# Patient Record
Sex: Female | Born: 1995 | Race: Black or African American | Hispanic: No | Marital: Single | Smoking: Never smoker
Health system: Southern US, Community
[De-identification: ages and names within clinical notes are randomized; demographics above are authoritative.]

## PROBLEM LIST (undated history)

## (undated) ENCOUNTER — Inpatient Hospital Stay (HOSPITAL_COMMUNITY): Payer: Self-pay

## (undated) DIAGNOSIS — B999 Unspecified infectious disease: Secondary | ICD-10-CM

## (undated) HISTORY — PX: NO PAST SURGERIES: SHX2092

---
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
## 2015-06-13 IMAGING — US US OB COMP LESS 14 WK
1 series · 14 of 28 positions shown · non-contrast
Comparison: None.

CLINICAL DATA: Vaginal bleeding.

EXAM:
OBSTETRIC <14 WK US AND TRANSVAGINAL OB US
TECHNIQUE: Both transabdominal and transvaginal ultrasound examinations were
performed for complete evaluation of the gestation as well as the
maternal uterus, adnexal regions, and pelvic cul-de-sac.
Transvaginal technique was performed to assess early pregnancy.

[Series 1: us ob comp less 14 wk · 0.17mm/px · 14 of 68 slices shown]
[im 3/68]
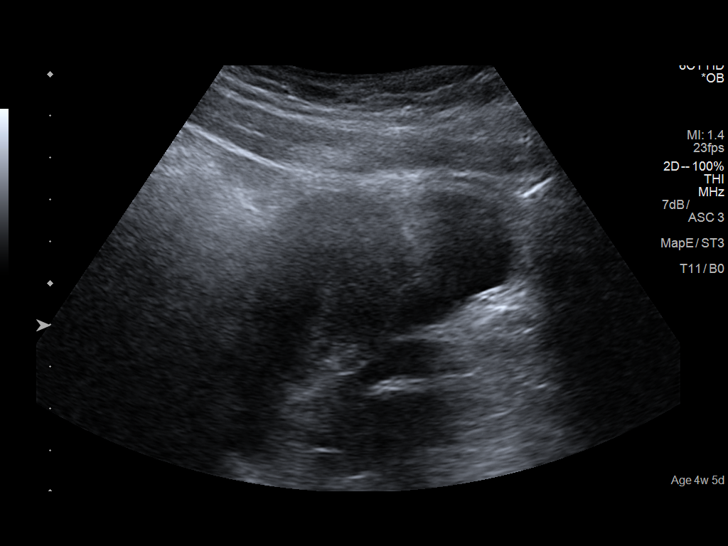
[im 8/68]
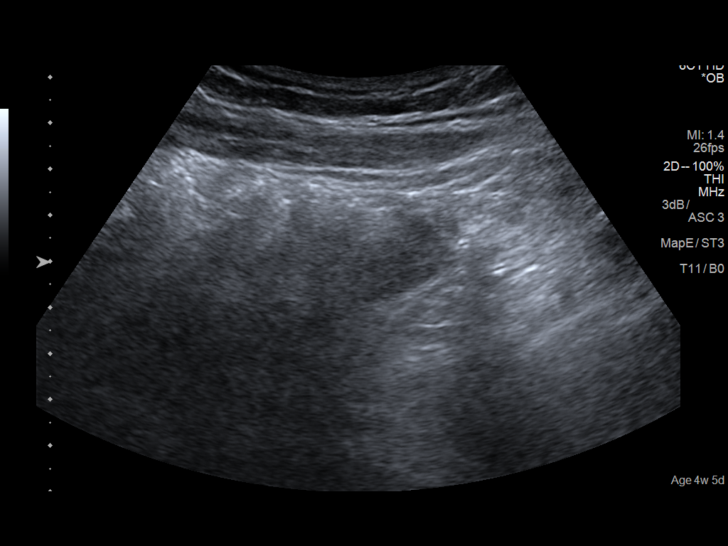
[im 13/68]
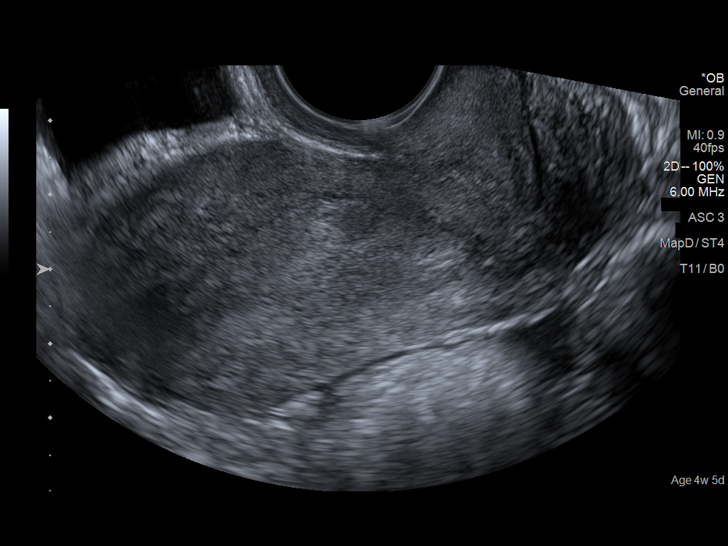
[im 18/68]
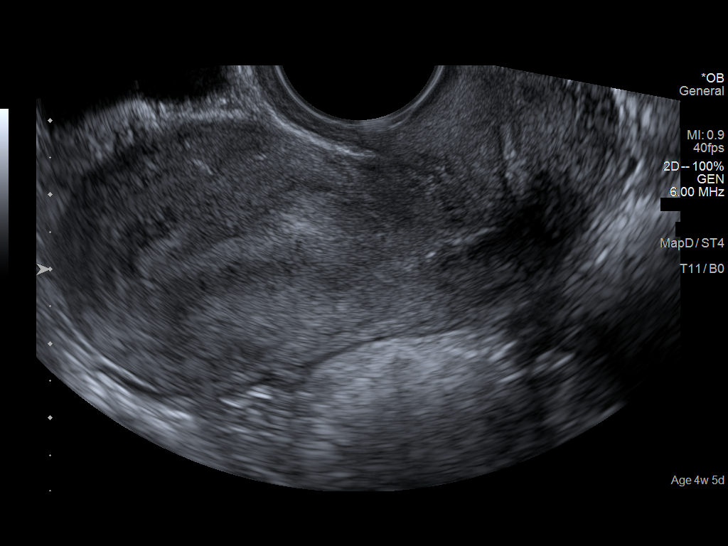
[im 23/68]
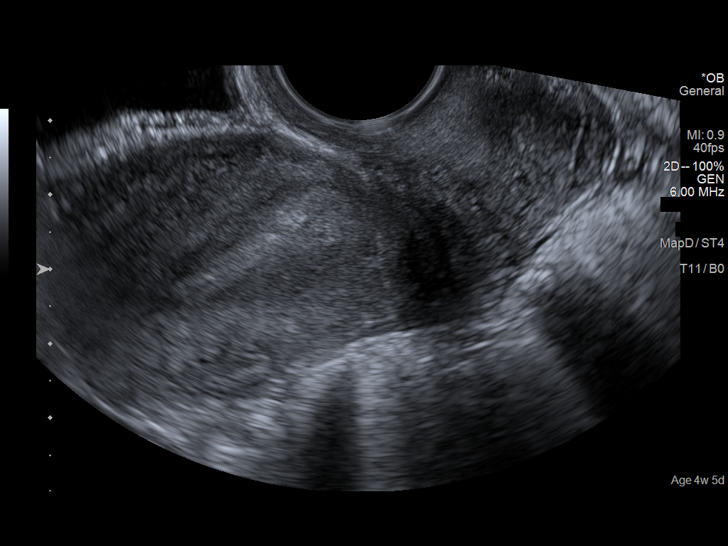
[im 28/68]
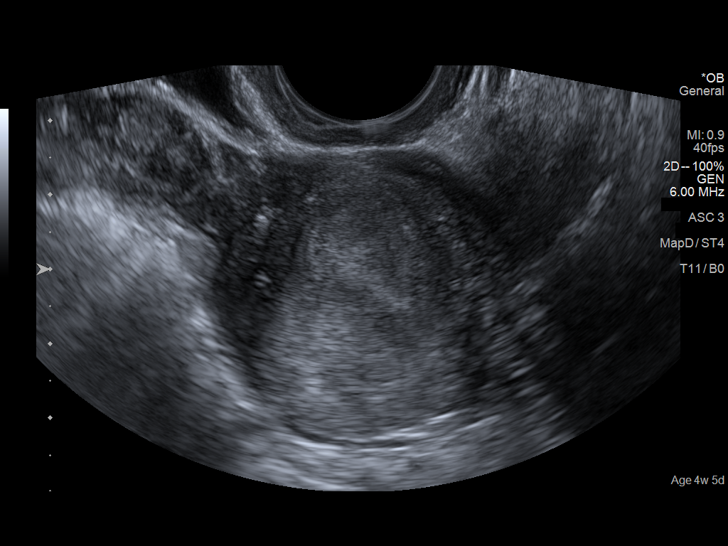
[im 33/68]
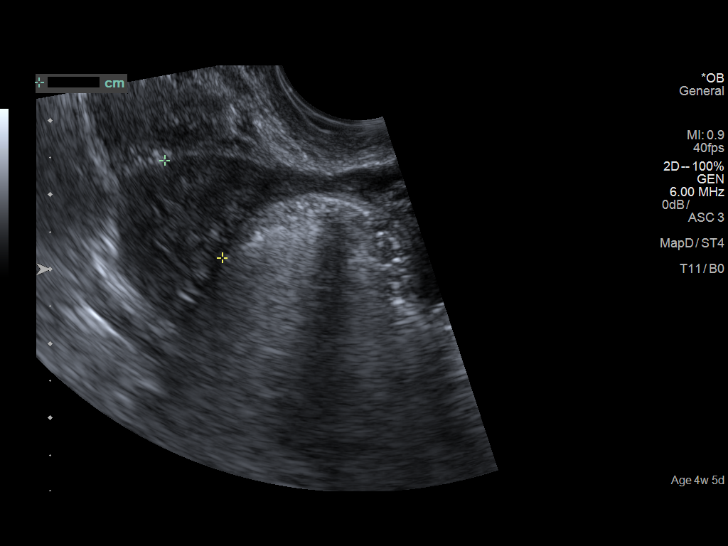
[im 38/68]
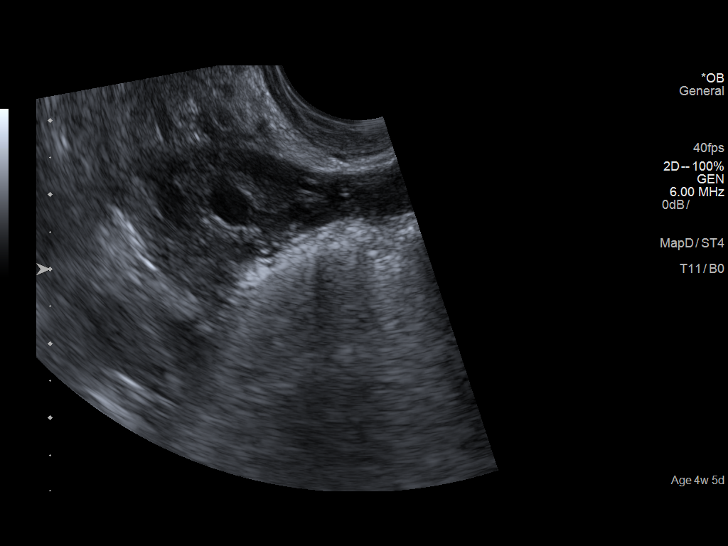
[im 43/68]
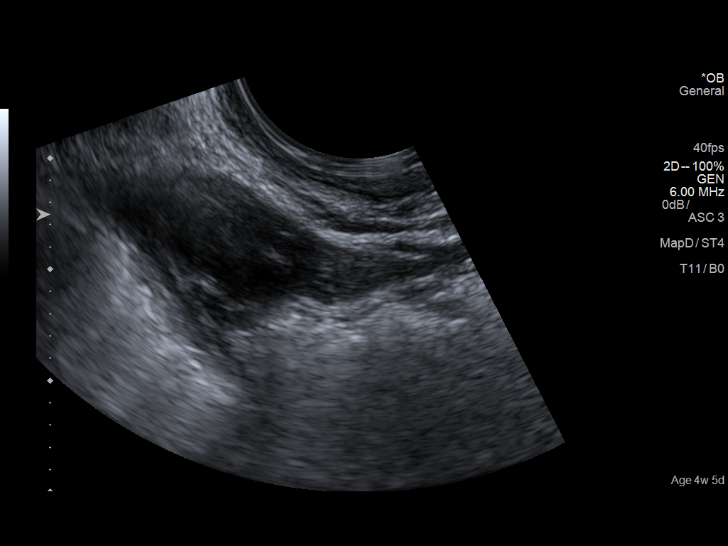
[im 48/68]
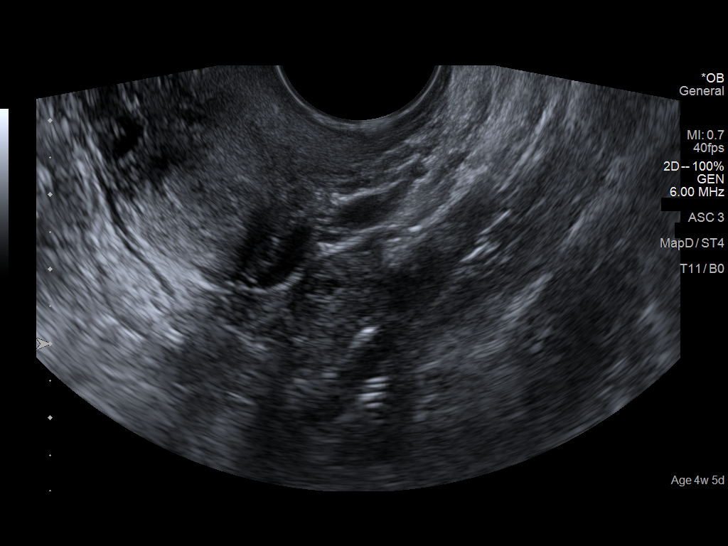
[im 53/68]
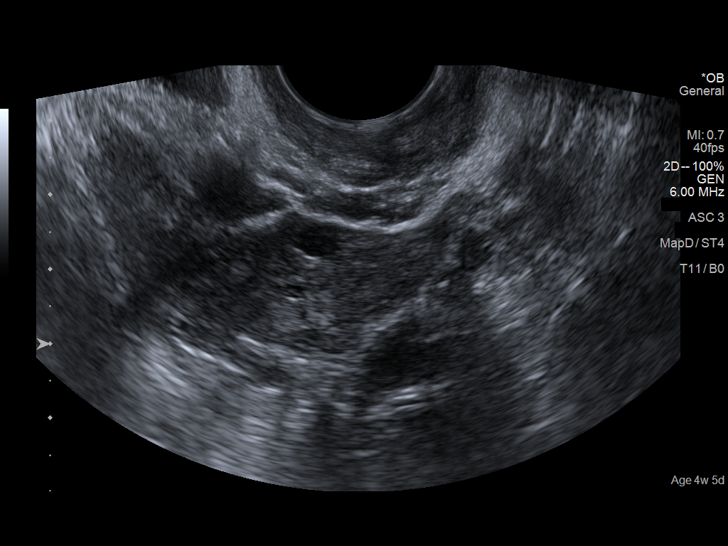
[im 58/68]
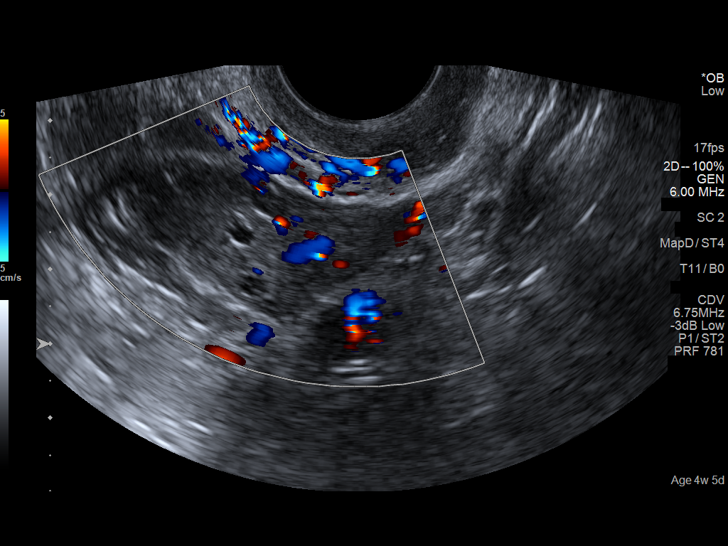
[im 63/68]
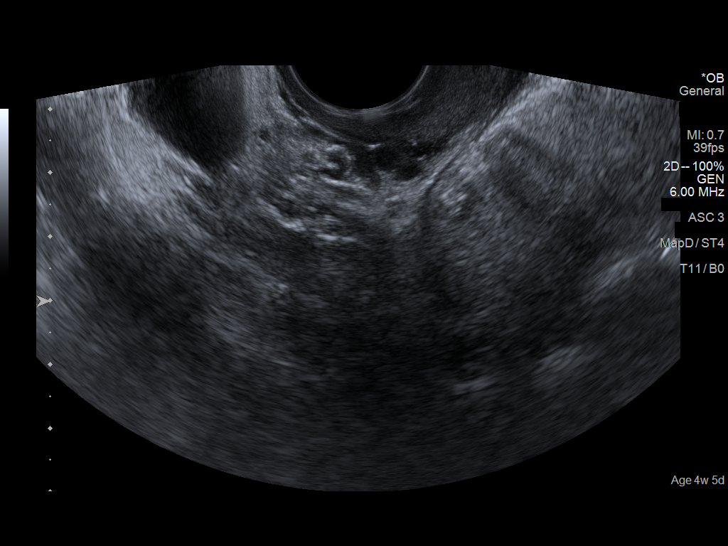
[im 68/68]
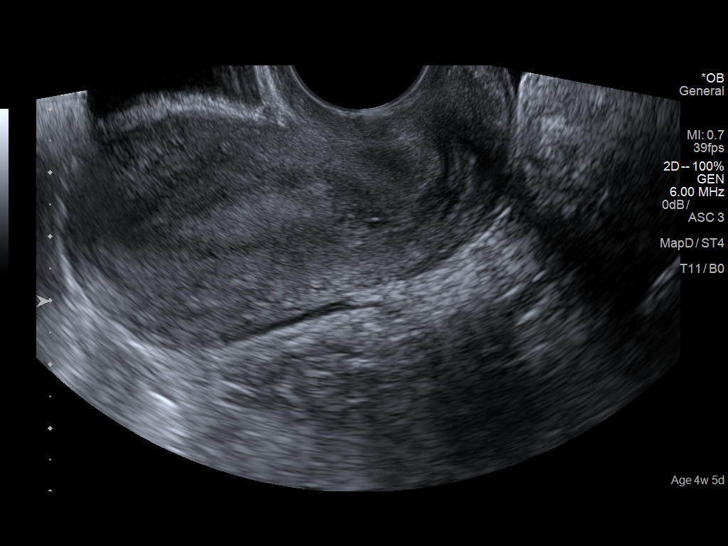

[14 of 28 positions shown; findings below may reference images not displayed]

FINDINGS: Intrauterine gestational sac: None

Yolk sac:  No

Embryo:  No

Cardiac Activity: No

Maternal uterus/adnexae: Ovaries are normal. Trace free fluid in the
pelvis.
IMPRESSION: Negative exam.  No evidence of intrauterine or ectopic pregnancy.

## 2015-12-09 ENCOUNTER — Emergency Department (HOSPITAL_COMMUNITY)
Admission: EM | Admit: 2015-12-09 | Discharge: 2015-12-09 | Disposition: A | Discharge status: Home / Self Care | Payer: Medicaid Other | Attending: Emergency Medicine | Admitting: Emergency Medicine

## 2015-12-09 ENCOUNTER — Emergency Department (HOSPITAL_COMMUNITY): Payer: Medicaid Other

## 2015-12-09 ENCOUNTER — Encounter (HOSPITAL_COMMUNITY): Payer: Self-pay

## 2015-12-09 DIAGNOSIS — R102 Pelvic and perineal pain: Secondary | ICD-10-CM | POA: Diagnosis not present

## 2015-12-09 DIAGNOSIS — Z3A01 Less than 8 weeks gestation of pregnancy: Secondary | ICD-10-CM | POA: Insufficient documentation

## 2015-12-09 DIAGNOSIS — N939 Abnormal uterine and vaginal bleeding, unspecified: Secondary | ICD-10-CM

## 2015-12-09 DIAGNOSIS — O208 Other hemorrhage in early pregnancy: Secondary | ICD-10-CM | POA: Insufficient documentation

## 2015-12-09 DIAGNOSIS — O3680X0 Pregnancy with inconclusive fetal viability, not applicable or unspecified: Secondary | ICD-10-CM

## 2015-12-09 DIAGNOSIS — R8271 Bacteriuria: Secondary | ICD-10-CM | POA: Diagnosis not present

## 2015-12-09 LAB — CBC WITH DIFFERENTIAL/PLATELET
BASOS ABS: 0 10*3/uL (ref 0.0–0.1)
BASOS PCT: 0 %
Eosinophils Absolute: 0.4 10*3/uL (ref 0.0–0.7)
Eosinophils Relative: 6 %
HEMATOCRIT: 38.3 % (ref 36.0–46.0)
HEMOGLOBIN: 12.5 g/dL (ref 12.0–15.0)
Lymphocytes Relative: 24 %
Lymphs Abs: 1.8 10*3/uL (ref 0.7–4.0)
MCH: 26.3 pg (ref 26.0–34.0)
MCHC: 32.6 g/dL (ref 30.0–36.0)
MCV: 80.6 fL (ref 78.0–100.0)
Monocytes Absolute: 0.5 10*3/uL (ref 0.1–1.0)
Monocytes Relative: 7 %
NEUTROS ABS: 4.6 10*3/uL (ref 1.7–7.7)
NEUTROS PCT: 63 %
Platelets: 267 10*3/uL (ref 150–400)
RBC: 4.75 MIL/uL (ref 3.87–5.11)
RDW: 13.2 % (ref 11.5–15.5)
WBC: 7.4 10*3/uL (ref 4.0–10.5)

## 2015-12-09 LAB — ABO/RH: ABO/RH(D): A POS

## 2015-12-09 LAB — URINE MICROSCOPIC-ADD ON

## 2015-12-09 LAB — WET PREP, GENITAL
CLUE CELLS WET PREP: NONE SEEN
Sperm: NONE SEEN
TRICH WET PREP: NONE SEEN
YEAST WET PREP: NONE SEEN

## 2015-12-09 LAB — URINALYSIS, ROUTINE W REFLEX MICROSCOPIC
Glucose, UA: NEGATIVE mg/dL
Ketones, ur: 15 mg/dL — AB
Nitrite: NEGATIVE
SPECIFIC GRAVITY, URINE: 1.024 (ref 1.005–1.030)
pH: 5.5 (ref 5.0–8.0)

## 2015-12-09 LAB — HCG, QUANTITATIVE, PREGNANCY: HCG, BETA CHAIN, QUANT, S: 12 m[IU]/mL — AB (ref <5)

## 2015-12-09 LAB — POC URINE PREG, ED: PREG TEST UR: NEGATIVE

## 2015-12-09 MED ORDER — CEPHALEXIN 500 MG PO CAPS: 500.0000 mg | ORAL_CAPSULE | Freq: Three times a day (TID) | ORAL | 0 refills | Status: AC

## 2015-12-09 NOTE — ED Notes (Signed)
Pt returned from US

## 2015-12-09 NOTE — ED Provider Notes (Signed)
[facility] DEPT Provider Note   CSN: [id number] Arrival date & time: 12/09/15  1102  First Provider Contact:  First MD Initiated Contact with Patient 12/09/15 1140        History   Chief Complaint Chief Complaint  Patient presents with  . Vaginal Bleeding    HPI Linda Curry is a 20 y.o. female.  HPI 20 year old G1P0 at estimated 4-1/2 weeks by LMP who presents with lower abdominal cramping and vaginal bleeding. The patient states that over the weekend she took a pregnancy test and noted that it was positive. Starting over the weekend she had a small amount of vaginal spotting, which she thought was due to implantation bleeding. This was minimal and resolved with wiping with toilet paper. She denies any pain at that time. The last 24 hours however she's had progressively worsening lower abdominal cramping. She also began to pass clots and had increase in vaginal bleeding. She has soaked through approximately 1 pad today. She has no history of STDs. No previous history of pregnancies. No nausea or vomiting. No fevers or chills. No dyspareunia.  History reviewed. No pertinent past medical history.  There are no active problems to display for this patient.   History reviewed. No pertinent surgical history.  OB History    Gravida Para Term Preterm AB Living   1             SAB TAB Ectopic Multiple Live Births                   Home Medications    Prior to Admission medications   Medication Sig Start Date End Date Taking? Authorizing Provider  amoxicillin-clavulanate (AUGMENTIN) 875-125 MG per tablet Take 1 tablet by mouth every 12 (twelve) hours. 03/13/12   Viviano Simas, NP  cephALEXin (KEFLEX) 500 MG capsule Take 1 capsule (500 mg total) by mouth 3 (three) times daily. 12/09/15 12/16/15  Shaune Pollack, MD  DM-Doxylamine-Acetaminophen (NYQUIL COLD & FLU PO) Take 1 tablet by mouth daily as needed (cold symptoms).    Historical Provider, MD  lidocaine (XYLOCAINE) 2  % solution Use as directed 20 mLs in the mouth or throat as needed for mouth pain. 04/17/14   Heather Laisure, PA-C  penicillin v potassium (VEETID) 500 MG tablet Take 1 tablet (500 mg total) by mouth 2 (two) times daily. 04/17/14   Heather Laisure, PA-C  Pseudoephedrine-APAP-DM (DAYQUIL PO) Take 2 tablets by mouth daily as needed (for cold symptoms).    Historical Provider, MD    Family History No family history on file.  Social History Social History  Substance Use Topics  . Smoking status: Never Smoker  . Smokeless tobacco: Never Used  . Alcohol use No     Allergies   Review of patient's allergies indicates no known allergies.   Review of Systems Review of Systems  Constitutional: Negative for chills, fatigue and fever.  HENT: Negative for congestion and rhinorrhea.   Eyes: Negative for visual disturbance.  Respiratory: Negative for cough, shortness of breath and wheezing.   Cardiovascular: Negative for chest pain and leg swelling.  Gastrointestinal: Negative for abdominal pain, diarrhea, nausea and vomiting.  Genitourinary: Positive for pelvic pain, vaginal bleeding and vaginal pain. Negative for dysuria, flank pain and vaginal discharge.  Musculoskeletal: Negative for neck pain and neck stiffness.  Skin: Negative for rash and wound.  Allergic/Immunologic: Negative for immunocompromised state.  Neurological: Negative for syncope, weakness and headaches.  All other systems reviewed and are negative.  Physical Exam Updated Vital Signs BP 125/78   Pulse 100   Temp 99.4 F (37.4 C) (Oral)   Resp 16   Ht 5' (1.524 m)   Wt 115 lb (52.2 kg)   LMP 11/06/2015   SpO2 97%   BMI 22.46 kg/m   Physical Exam  Constitutional: She is oriented to person, place, and time. She appears well-developed and well-nourished. No distress.  HENT:  Head: Normocephalic and atraumatic.  Mouth/Throat: Oropharynx is clear and moist.  Eyes: Conjunctivae are normal. Pupils are equal,  round, and reactive to light.  Neck: Neck supple.  Cardiovascular: Normal rate, regular rhythm and normal heart sounds.  Exam reveals no friction rub.   No murmur heard. Pulmonary/Chest: Effort normal and breath sounds normal. No respiratory distress. She has no wheezes. She has no rales.  Abdominal: She exhibits no distension. There is tenderness (Mild suprapubic tenderness).  Genitourinary: Pelvic exam was performed with patient supine.  Genitourinary Comments: Moderate volume dark red blood in vaginal vault. Cervical os is closed, with no active bleeding. No CMT. Uterus is not tender.  Musculoskeletal: She exhibits no edema.  Neurological: She is alert and oriented to person, place, and time. She exhibits normal muscle tone.  Skin: Skin is warm. Capillary refill takes less than 2 seconds.  Nursing note and vitals reviewed.    ED Treatments / Results  Labs (all labs ordered are listed, but only abnormal results are displayed) Labs Reviewed  WET PREP, GENITAL - Abnormal; Notable for the following:       Result Value   WBC, Wet Prep HPF POC MANY (*)    All other components within normal limits  URINALYSIS, ROUTINE W REFLEX MICROSCOPIC (NOT AT [facility]) - Abnormal; Notable for the following:    Color, Urine RED (*)    APPearance TURBID (*)    Hgb urine dipstick LARGE (*)    Bilirubin Urine SMALL (*)    Ketones, ur 15 (*)    Protein, ur >300 (*)    Leukocytes, UA SMALL (*)    All other components within normal limits  HCG, QUANTITATIVE, PREGNANCY - Abnormal; Notable for the following:    hCG, Beta Chain, Quant, S 12 (*)    All other components within normal limits  URINE MICROSCOPIC-ADD ON - Abnormal; Notable for the following:    Squamous Epithelial / LPF 6-30 (*)    Bacteria, UA MANY (*)    All other components within normal limits  CBC WITH DIFFERENTIAL/PLATELET  POC URINE PREG, ED  ABO/RH  GC/CHLAMYDIA PROBE AMP ([place]) NOT AT [facility]    EKG  EKG Interpretation None         Radiology US Ob Comp Less 14 Wks  Result Date: 12/09/2015 CLINICAL DATA:  Vaginal bleeding. EXAM: OBSTETRIC <14 WK [place] AND TRANSVAGINAL OB US TECHNIQUE: Both transabdominal and transvaginal ultrasound examinations were performed for complete evaluation of the gestation as well as the maternal uterus, adnexal regions, and pelvic cul-de-sac. Transvaginal technique was performed to assess early pregnancy. COMPARISON:  None. FINDINGS: Intrauterine gestational sac: None Yolk sac:  No Embryo:  No Cardiac Activity: No Maternal uterus/adnexae: Ovaries are normal. Trace free fluid in the pelvis. IMPRESSION: Negative exam.  No evidence of intrauterine or ectopic pregnancy. Electronically Signed   By: Francene Boyers M.D.   On: 12/09/2015 14:04   US Ob Transvaginal  Result Date: 12/09/2015 CLINICAL DATA:  Vaginal bleeding. EXAM: OBSTETRIC <14 WK [place] AND TRANSVAGINAL OB US TECHNIQUE: Both transabdominal and transvaginal  ultrasound examinations were performed for complete evaluation of the gestation as well as the maternal uterus, adnexal regions, and pelvic cul-de-sac. Transvaginal technique was performed to assess early pregnancy. COMPARISON:  None. FINDINGS: Intrauterine gestational sac: None Yolk sac:  No Embryo:  No Cardiac Activity: No Maternal uterus/adnexae: Ovaries are normal. Trace free fluid in the pelvis. IMPRESSION: Negative exam.  No evidence of intrauterine or ectopic pregnancy. Electronically Signed   By: Francene BoyersJames  Maxwell M.D.   On: 12/09/2015 14:04    Procedures Procedures (including critical care time)  Medications Ordered in ED Medications - No data to display   Initial Impression / Assessment and Plan / ED Course  I have reviewed the triage vital signs and the nursing notes.  Pertinent labs & imaging results that were available during my care of the patient were reviewed by me and considered in my medical decision making (see chart for details).  Clinical Course  Value Comment By  Time  HCG, Beta Chain, Quant, S: (!) 12 (Reviewed) Shaune Pollackameron Delaine Canter, MD 08/10 1308  US OB Comp Less 14 Wks (Reviewed) Shaune Pollackameron Garrell Flagg, MD 08/10 1340  US OB Comp Less 14 Wks (Reviewed) Shaune Pollackameron Michaelangelo Mittelman, MD 08/10 1340  HCG, Beta Chain, Quant, S: (!) 12 (Reviewed) Shaune Pollackameron Cherrish Vitali, MD 08/10 1344  US OB Comp Less 14 Wks (Reviewed) Shaune Pollackameron Leviathan Macera, MD 08/10 1344  US OB Transvaginal (Reviewed) Shaune Pollackameron Elfrieda Espino, MD 08/10 1355    20 yo G1P0 at estimated 4.[redacted] wk GA by LMP who presents with lower abdominal cramping and vaginal bleeding. Unfortunately, primary suspicion is complete SAB. HCG is only 15 on quant and TVUS shows no intrauterine or other pregnancy. DDx includes threatened AB though low quant and degree of bleeding makes complete AB more likely. No signs of septic or incomplete AB. Pt HDS with stable Hgb. RH+ so Rhogam is not indicated. Discussed labs, imaging with pt in detail. I have also discussed with OB on call. Will send for 48 hr quant. Advised pt on DDx of complete AB, threatened AB, ectopic, versus less likely early pregnancy.  Final Clinical Impressions(s) / ED Diagnoses   Final diagnoses:  Vaginal bleeding  Abnormal uterine bleeding (AUB)  Pregnancy of unknown anatomic location  Bacteriuria    New Prescriptions Discharge Medication List as of 12/09/2015  2:25 PM    START taking these medications   Details  cephALEXin (KEFLEX) 500 MG capsule Take 1 capsule (500 mg total) by mouth 3 (three) times daily., Starting Thu 12/09/2015, Until Thu 12/16/2015, Print         Shaune Pollackameron Lexa Coronado, MD 12/09/15 1810

## 2015-12-09 NOTE — ED Notes (Signed)
Pt transported to US

## 2015-12-09 NOTE — ED Triage Notes (Signed)
Patient here with vaginal bleeding since am, states she took home pregnancy test that was positive. denies pain. Last menstrual period in July.

## 2015-12-10 ENCOUNTER — Telehealth: Payer: Self-pay | Admitting: *Deleted

## 2015-12-10 LAB — GC/CHLAMYDIA PROBE AMP (~~LOC~~) NOT AT ARMC
CHLAMYDIA, DNA PROBE: NEGATIVE
Neisseria Gonorrhea: NEGATIVE

## 2015-12-10 NOTE — Telephone Encounter (Signed)
Pt called regarding discharge instructions for follow up labs.  [facility].  [facility] answering services told pt that since she is not a current pt, she would have to call [facility]; she called EDCM.  EDCM researched to find that pt needs Rx stating which lab was needed and to then report to [facility] for blood draw.  EDCM obtained Rx for pt and instructed her to report to [facility]U tomorrow .  Pt very appreciative.  No other CM needs at this time.  Marcina Kinnison J. Lucretia RoersWood, RN, BSN, [employer] [phone]

## 2015-12-11 ENCOUNTER — Encounter (HOSPITAL_COMMUNITY): Payer: Self-pay | Admitting: *Deleted

## 2015-12-11 ENCOUNTER — Inpatient Hospital Stay (HOSPITAL_COMMUNITY)
Admission: AD | Admit: 2015-12-11 | Discharge: 2015-12-11 | Disposition: A | Discharge status: Home / Self Care | Payer: Medicaid Other | Source: Ambulatory Visit | Attending: Obstetrics & Gynecology | Admitting: Obstetrics & Gynecology

## 2015-12-11 DIAGNOSIS — O26891 Other specified pregnancy related conditions, first trimester: Secondary | ICD-10-CM | POA: Diagnosis present

## 2015-12-11 DIAGNOSIS — Z79899 Other long term (current) drug therapy: Secondary | ICD-10-CM | POA: Insufficient documentation

## 2015-12-11 DIAGNOSIS — R109 Unspecified abdominal pain: Secondary | ICD-10-CM | POA: Insufficient documentation

## 2015-12-11 DIAGNOSIS — O4691 Antepartum hemorrhage, unspecified, first trimester: Secondary | ICD-10-CM

## 2015-12-11 DIAGNOSIS — O209 Hemorrhage in early pregnancy, unspecified: Secondary | ICD-10-CM

## 2015-12-11 HISTORY — DX: Unspecified infectious disease: B99.9

## 2015-12-11 LAB — HCG, QUANTITATIVE, PREGNANCY: hCG, Beta Chain, Quant, S: 16 m[IU]/mL — ABNORMAL HIGH (ref <5)

## 2015-12-11 NOTE — Discharge Instructions (Signed)
Vaginal Bleeding During Pregnancy, First Trimester °A small amount of bleeding (spotting) from the vagina is common in early pregnancy. Sometimes the bleeding is normal and is not a problem, and sometimes it is a sign of something serious. Be sure to tell your doctor about any bleeding from your vagina right away. °HOME CARE °· Watch your condition for any changes. °· Follow your doctor's instructions about how active you can be. °· If you are on bed rest: °¨ You may need to stay in bed and only get up to use the bathroom. °¨ You may be allowed to do some activities. °¨ If you need help, make plans for someone to help you. °· Write down: °¨ The number of pads you use each day. °¨ How often you change pads. °¨ How soaked (saturated) your pads are. °· Do not use tampons. °· Do not douche. °· Do not have sex or orgasms until your doctor says it is okay. °· If you pass any tissue from your vagina, save the tissue so you can show it to your doctor. °· Only take medicines as told by your doctor. °· Do not take aspirin because it can make you bleed. °· Keep all follow-up visits as told by your doctor. °GET HELP IF:  °· You bleed from your vagina. °· You have cramps. °· You have labor pains. °· You have a fever that does not go away after you take medicine. °GET HELP RIGHT AWAY IF:  °· You have very bad cramps in your back or belly (abdomen). °· You pass large clots or tissue from your vagina. °· You bleed more. °· You feel light-headed or weak. °· You pass out (faint). °· You have chills. °· You are leaking fluid or have a gush of fluid from your vagina. °· You pass out while pooping (having a bowel movement). °MAKE SURE YOU: °· Understand these instructions. °· Will watch your condition. °· Will get help right away if you are not doing well or get worse. °  °This information is not intended to replace advice given to you by your health care provider. Make sure you discuss any questions you have with your health care  provider. °  °Document Released: 09/01/2013 Document Reviewed: 09/01/2013 °Elsevier Interactive Patient Education ©2016 Elsevier Inc. ° °Pelvic Rest °Pelvic rest is sometimes recommended for women when:  °· The placenta is partially or completely covering the opening of the cervix (placenta previa). °· There is bleeding between the uterine wall and the amniotic sac in the first trimester (subchorionic hemorrhage). °· The cervix begins to open without labor starting (incompetent cervix, cervical insufficiency). °· The labor is too early (preterm labor). °HOME CARE INSTRUCTIONS °· Do not have sexual intercourse, stimulation, or an orgasm. °· Do not use tampons, douche, or put anything in the vagina. °· Do not lift anything over 10 pounds (4.5 kg). °· Avoid strenuous activity or straining your pelvic muscles. °SEEK MEDICAL CARE IF:  °· You have any vaginal bleeding during pregnancy. Treat this as a potential emergency. °· You have cramping pain felt low in the stomach (stronger than menstrual cramps). °· You notice vaginal discharge (watery, mucus, or bloody). °· You have a low, dull backache. °· There are regular contractions or uterine tightening. °SEEK IMMEDIATE MEDICAL CARE IF: °You have vaginal bleeding and have placenta previa.  °  °This information is not intended to replace advice given to you by your health care provider. Make sure you discuss any questions you have with   your health care provider. °  °Document Released: 08/12/2010 Document Revised: 07/10/2011 Document Reviewed: 10/19/2014 °Elsevier Interactive Patient Education ©2016 Elsevier Inc. ° °

## 2015-12-11 NOTE — MAU Provider Note (Signed)
  History     CSN: [id number]  Arrival date and time: 12/11/15 1136   None     Chief Complaint  Patient presents with  . Follow-up   HPI  Linda Curry is a 20 y.o. G1P0 at 526w0d. She presents for f/u BHCG. She went to the ED 8/10 for bleeding and cramping. Her BHCG was 12, U/S showed no IUGS or adnexal masses. Today she report light bleeding, no pain.  OB History    Gravida Para Term Preterm AB Living   1             SAB TAB Ectopic Multiple Live Births                  Past Medical History:  Diagnosis Date  . Infection    UTI    Past Surgical History:  Procedure Laterality Date  . NO PAST SURGERIES      Family History  Problem Relation Age of Onset  . Asthma Mother   . Cancer Maternal Grandmother     pancreatic    Social History  Substance Use Topics  . Smoking status: Never Smoker  . Smokeless tobacco: Never Used  . Alcohol use No    Allergies: No Known Allergies  Prescriptions Prior to Admission  Medication Sig Dispense Refill Last Dose  . amoxicillin-clavulanate (AUGMENTIN) 875-125 MG per tablet Take 1 tablet by mouth every 12 (twelve) hours. 14 tablet 0   . cephALEXin (KEFLEX) 500 MG capsule Take 1 capsule (500 mg total) by mouth 3 (three) times daily. 21 capsule 0   . DM-Doxylamine-Acetaminophen (NYQUIL COLD & FLU PO) Take 1 tablet by mouth daily as needed (cold symptoms).   04/16/2014 at Unknown time  . lidocaine (XYLOCAINE) 2 % solution Use as directed 20 mLs in the mouth or throat as needed for mouth pain. 100 mL 0   . penicillin v potassium (VEETID) 500 MG tablet Take 1 tablet (500 mg total) by mouth 2 (two) times daily. 14 tablet 0   . Pseudoephedrine-APAP-DM (DAYQUIL PO) Take 2 tablets by mouth daily as needed (for cold symptoms).   04/16/2014 at Unknown time    Review of Systems  Constitutional: Negative for chills and fever.  Gastrointestinal: Negative for abdominal pain.  Genitourinary:       Light vaginal bleeding  Neurological:  Negative for dizziness.   Physical Exam   Blood pressure 104/64, pulse 79, temperature 98.1 F (36.7 C), temperature source Oral, resp. rate 16, last menstrual period 11/06/2015.  Physical Exam  Nursing note and vitals reviewed. Constitutional: She is oriented to person, place, and time. She appears well-developed and well-nourished.  Musculoskeletal: Normal range of motion.  Neurological: She is alert and oriented to person, place, and time.  Skin: Skin is warm and dry.  Psychiatric: She has a normal mood and affect. Her behavior is normal.    MAU Course  Procedures  MDM   Assessment and Plan  BHCG increased to 16 from 12 in 48 hr Ectopic precautions reviewed Return in 48 hr, 8/14 to [facility] @11  am. If unable to keep 11 am appt, to return to MAU  Sheila Gervasi M. 12/11/2015, 1:40 PM

## 2015-12-11 NOTE — MAU Note (Signed)
Doing ok.  Is bleeding, started on 8/10- reason for ED visit. Knew she was preg. No pain.

## 2015-12-13 ENCOUNTER — Telehealth: Payer: Self-pay | Admitting: *Deleted

## 2015-12-13 ENCOUNTER — Other Ambulatory Visit: Payer: Self-pay

## 2015-12-13 DIAGNOSIS — O3680X0 Pregnancy with inconclusive fetal viability, not applicable or unspecified: Secondary | ICD-10-CM

## 2015-12-13 LAB — HCG, QUANTITATIVE, PREGNANCY: hCG, Beta Chain, Quant, S: 17 m[IU]/mL — ABNORMAL HIGH (ref <5)

## 2015-12-13 NOTE — Progress Notes (Signed)
Patient presented to office today for quant beta. Patient number has increase by 1. Patient stated she having some light bleeding at this time with minimal pain. Patient was instructed to follow up in two days for a repeat quant. Patient verbalizes understanding at this time.

## 2015-12-15 ENCOUNTER — Other Ambulatory Visit: Payer: Self-pay

## 2015-12-15 DIAGNOSIS — O200 Threatened abortion: Secondary | ICD-10-CM

## 2015-12-16 LAB — HCG, QUANTITATIVE, PREGNANCY: hCG, Beta Chain, Quant, S: 13 m[IU]/mL — ABNORMAL HIGH

## 2015-12-17 ENCOUNTER — Telehealth: Payer: Self-pay | Admitting: *Deleted

## 2015-12-17 NOTE — Telephone Encounter (Signed)
[facility]

## 2015-12-17 NOTE — Telephone Encounter (Signed)
Patient have been given BHCG results. She has been scheduled an appointment to see a provider on 12/20/2015. Message has been left.

## 2015-12-20 ENCOUNTER — Ambulatory Visit: Payer: Self-pay | Admitting: Obstetrics and Gynecology

## 2015-12-20 ENCOUNTER — Telehealth: Payer: Self-pay

## 2015-12-20 NOTE — Telephone Encounter (Signed)
Per Dr.Dove patient needs to come back in to discuss SAB. I called patient on 12/17/2015 and left a message for her to come in today for an appointment. But patient was a no-show. I have called her to schedule another appointment for this week for her quant levels.

## 2015-12-21 NOTE — Telephone Encounter (Signed)
Patient called and left message stating she had received a call a few days ago and recently had a miscarriage. Patient is calling back wanting to know what follow up she needs. Called patient, no answer- left message to call us back

## 2015-12-22 ENCOUNTER — Ambulatory Visit: Payer: Self-pay | Admitting: Obstetrics & Gynecology

## 2015-12-23 ENCOUNTER — Encounter: Payer: Self-pay | Admitting: Obstetrics & Gynecology

## 2015-12-23 ENCOUNTER — Ambulatory Visit (INDEPENDENT_AMBULATORY_CARE_PROVIDER_SITE_OTHER): Payer: Self-pay | Admitting: Obstetrics & Gynecology

## 2015-12-23 VITALS — BP 122/64 | HR 80 | Ht 59.0 in | Wt 120.1 lb

## 2015-12-23 DIAGNOSIS — O0281 Inappropriate change in quantitative human chorionic gonadotropin (hCG) in early pregnancy: Secondary | ICD-10-CM

## 2015-12-23 DIAGNOSIS — Z3201 Encounter for pregnancy test, result positive: Secondary | ICD-10-CM

## 2015-12-23 NOTE — Progress Notes (Signed)
CC: f/u for positive pregnancy test  20 y.o.  Patient's last menstrual period was 11/06/2015. G1P0 No vaginal bleeding or pain. HCG today 13  HCG decreased on last check, no complaint. May have been false positive. [facility], [facility]  Adam PhenixJames G Arnold, MD 12/23/2015

## 2015-12-28 NOTE — Telephone Encounter (Signed)
Patient has already been seen for appt

## 2016-05-02 ENCOUNTER — Encounter (HOSPITAL_COMMUNITY): Payer: Self-pay

## 2016-05-02 ENCOUNTER — Emergency Department (HOSPITAL_COMMUNITY)
Admission: EM | Admit: 2016-05-02 | Discharge: 2016-05-02 | Disposition: A | Discharge status: Home / Self Care | Payer: BC Managed Care – PPO | Attending: Emergency Medicine | Admitting: Emergency Medicine

## 2016-05-02 DIAGNOSIS — J069 Acute upper respiratory infection, unspecified: Secondary | ICD-10-CM | POA: Diagnosis present

## 2016-05-02 DIAGNOSIS — J3089 Other allergic rhinitis: Secondary | ICD-10-CM

## 2016-05-02 MED ORDER — CETIRIZINE HCL 10 MG PO TABS: 10.0000 mg | ORAL_TABLET | Freq: Every day | ORAL | 0 refills | Status: DC

## 2016-05-02 MED ORDER — BUDESONIDE 32 MCG/ACT NA SUSP: 2.0000 | Freq: Every day | NASAL | 0 refills | Status: DC

## 2016-05-02 NOTE — ED Triage Notes (Signed)
Pt presents for ongoing URI symptoms x 6 months. Pt reports she has been taking OTC sinus medication like dayquil with no improvement. Pt reports she tried allergy medication but did not take it consistently and had no improvement. Pt denies CP/SOB. Pt ambulatory, AxO x4.

## 2016-05-02 NOTE — ED Provider Notes (Signed)
[facility]-EMERGENCY DEPT Provider Note   CSN: [id number] Arrival date & time: 05/02/16  1330  By signing my name below, I, Rosario AdieWilliam Andrew Hiatt, attest that this documentation has been prepared under the direction and in the presence of Felicie Mornavid Jorah Hua, NP. Electronically Signed: Rosario AdieWilliam Andrew Hiatt, ED Scribe. 05/02/16. 5:05 PM.  History   Chief Complaint Chief Complaint  Patient presents with  . URI   The history is provided by the patient. No language interpreter was used.  URI   This is a recurrent problem. The current episode started more than 1 week ago. The problem has not changed since onset.There has been no fever. Associated symptoms include congestion and sore throat. Pertinent negatives include no chest pain, no nausea, no vomiting and no sinus pain. Treatments tried: Advil and decongestants. The treatment provided no relief.    HPI Comments: Linda Curry is a 21 y.o. female with no pertinent PMHx, who presents to the Emergency Department complaining of persistent, unchanged nasal congestion onset approximately six months ago. She notes associated intermittent rhinorrhea and a mild sore throat secondary to her nasal congestion. Pt has been taking Advil and Mucinex without relief of her symptoms. Pt has not attempted taking an allergy medication for her symptoms. Her sore throat is mildly exacerbated with swallowing. Pt notes that one of her roommates does have a canine, and she is around it frequently. No known h/o environmental/seasonal allergies. She denies chest pain, shortness of breath, sinus pain/pressure, nausea, vomiting, fever, chills, or any other associated symptoms.   Past Medical History:  Diagnosis Date  . Infection    UTI   There are no active problems to display for this patient.  Past Surgical History:  Procedure Laterality Date  . NO PAST SURGERIES     OB History    Gravida Para Term Preterm AB Living   1             SAB TAB Ectopic Multiple Live Births                   Home Medications    Prior to Admission medications   Medication Sig Start Date End Date Taking? Authorizing Provider  cephALEXin (KEFLEX) 500 MG capsule Take 500 mg by mouth 3 (three) times daily.    Historical Provider, MD   Family History Family History  Problem Relation Age of Onset  . Asthma Mother   . Cancer Maternal Grandmother     pancreatic   Social History Social History  Substance Use Topics  . Smoking status: Never Smoker  . Smokeless tobacco: Never Used  . Alcohol use No   Allergies   Patient has no known allergies.  Review of Systems Review of Systems  Constitutional: Negative for chills and fever.  HENT: Positive for congestion and sore throat. Negative for sinus pain and sinus pressure.   Respiratory: Negative for shortness of breath.   Cardiovascular: Negative for chest pain.  Gastrointestinal: Negative for nausea and vomiting.  All other systems reviewed and are negative.  Physical Exam Updated Vital Signs BP 115/67 (BP Location: Right Arm)   Pulse 73   Resp 16   Ht 5' (1.524 m)   Wt 115 lb (52.2 kg)   LMP 11/06/2015   SpO2 97%   Breastfeeding? Unknown   BMI 22.46 kg/m   Physical Exam  Constitutional: She appears well-developed and well-nourished. No distress.  HENT:  Head: Normocephalic and atraumatic.  Right Ear: Tympanic membrane and external ear normal.  Left Ear: Tympanic membrane and external ear normal.  Mouth/Throat: Uvula is midline and mucous membranes are normal. No trismus in the jaw. No uvula swelling. Posterior oropharyngeal erythema present. No oropharyngeal exudate, posterior oropharyngeal edema or tonsillar abscesses. No tonsillar exudate.  Eyes: Conjunctivae are normal.  Neck: Normal range of motion.  Cardiovascular: Normal rate, regular rhythm and normal heart sounds.   No murmur heard. Pulmonary/Chest: Effort normal and breath sounds normal. No respiratory distress. She has no wheezes. She has no rales.   Abdominal: She exhibits no distension.  Musculoskeletal: Normal range of motion.  Neurological: She is alert.  Skin: No pallor.  Psychiatric: She has a normal mood and affect. Her behavior is normal.  Nursing note and vitals reviewed.  ED Treatments / Results  DIAGNOSTIC STUDIES: Oxygen Saturation is 97% on RA, normal by my interpretation.   COORDINATION OF CARE: 5:03 PM-Discussed next steps with pt. Pt verbalized understanding and is agreeable with the plan.   Labs (all labs ordered are listed, but only abnormal results are displayed) Labs Reviewed - No data to display  EKG  EKG Interpretation None      Radiology No results found.  Procedures Procedures   Medications Ordered in ED Medications - No data to display  Initial Impression / Assessment and Plan / ED Course  I have reviewed the triage vital signs and the nursing notes.  Pertinent labs & imaging results that were available during my care of the patient were reviewed by me and considered in my medical decision making (see chart for details).  Clinical Course    Patient presentation consistent with allergic rhinitis.  No indication of infectious process. Symptomatic care instructions provided, along with prescription for daily zyrtec and rhinocort. Return precautions discussed. Follow up with PCP recommended.  Final Clinical Impressions(s) / ED Diagnoses   Final diagnoses:  Chronic non-seasonal allergic rhinitis, unspecified trigger   New Prescriptions Discharge Medication List as of 05/02/2016  5:21 PM    START taking these medications   Details  budesonide (RHINOCORT AQUA) 32 MCG/ACT nasal spray Place 2 sprays into both nostrils daily., Starting Tue 05/02/2016, Print    cetirizine (ZYRTEC) 10 MG tablet Take 1 tablet (10 mg total) by mouth daily., Starting Tue 05/02/2016, Print       I personally performed the services described in this documentation, which was scribed in my presence. The recorded  information has been reviewed and is accurate.     Felicie Morn, NP 05/02/16 1846    Geoffery Lyons, MD 05/02/16 2027

## 2016-05-02 NOTE — ED Notes (Signed)
[employer] coupon 

## 2016-06-05 DIAGNOSIS — Z30013 Encounter for initial prescription of injectable contraceptive: Secondary | ICD-10-CM | POA: Diagnosis not present

## 2016-06-05 DIAGNOSIS — Z309 Encounter for contraceptive management, unspecified: Secondary | ICD-10-CM | POA: Diagnosis not present

## 2016-07-24 ENCOUNTER — Other Ambulatory Visit (HOSPITAL_COMMUNITY)
Admission: RE | Admit: 2016-07-24 | Discharge: 2016-07-24 | Disposition: A | Discharge status: Home / Self Care | Payer: BC Managed Care – PPO | Source: Ambulatory Visit | Attending: Obstetrics and Gynecology | Admitting: Obstetrics and Gynecology

## 2016-07-24 ENCOUNTER — Other Ambulatory Visit: Payer: Self-pay | Admitting: Obstetrics and Gynecology

## 2016-07-24 DIAGNOSIS — Z3009 Encounter for other general counseling and advice on contraception: Secondary | ICD-10-CM | POA: Diagnosis not present

## 2016-07-24 DIAGNOSIS — Z113 Encounter for screening for infections with a predominantly sexual mode of transmission: Secondary | ICD-10-CM | POA: Diagnosis not present

## 2016-07-24 DIAGNOSIS — N898 Other specified noninflammatory disorders of vagina: Secondary | ICD-10-CM | POA: Diagnosis not present

## 2016-07-24 DIAGNOSIS — Z01411 Encounter for gynecological examination (general) (routine) with abnormal findings: Secondary | ICD-10-CM | POA: Diagnosis not present

## 2016-07-24 DIAGNOSIS — Z01419 Encounter for gynecological examination (general) (routine) without abnormal findings: Secondary | ICD-10-CM | POA: Diagnosis not present

## 2016-07-26 LAB — CYTOLOGY - PAP
Chlamydia: NEGATIVE
DIAGNOSIS: NEGATIVE
Neisseria Gonorrhea: NEGATIVE

## 2016-08-07 DIAGNOSIS — J019 Acute sinusitis, unspecified: Secondary | ICD-10-CM | POA: Diagnosis not present

## 2016-09-22 DIAGNOSIS — Z30430 Encounter for insertion of intrauterine contraceptive device: Secondary | ICD-10-CM | POA: Diagnosis not present

## 2017-01-09 ENCOUNTER — Inpatient Hospital Stay (HOSPITAL_COMMUNITY)
Admission: AD | Admit: 2017-01-09 | Discharge: 2017-01-09 | Disposition: A | Discharge status: Home / Self Care | Payer: 59 | Source: Ambulatory Visit | Attending: Obstetrics and Gynecology | Admitting: Obstetrics and Gynecology

## 2017-01-09 ENCOUNTER — Encounter (HOSPITAL_COMMUNITY): Payer: Self-pay | Admitting: *Deleted

## 2017-01-09 DIAGNOSIS — R110 Nausea: Secondary | ICD-10-CM | POA: Diagnosis not present

## 2017-01-09 DIAGNOSIS — R109 Unspecified abdominal pain: Secondary | ICD-10-CM | POA: Insufficient documentation

## 2017-01-09 DIAGNOSIS — Z79899 Other long term (current) drug therapy: Secondary | ICD-10-CM | POA: Diagnosis not present

## 2017-01-09 DIAGNOSIS — N939 Abnormal uterine and vaginal bleeding, unspecified: Secondary | ICD-10-CM | POA: Diagnosis not present

## 2017-01-09 DIAGNOSIS — T839XXA Unspecified complication of genitourinary prosthetic device, implant and graft, initial encounter: Secondary | ICD-10-CM

## 2017-01-09 DIAGNOSIS — Y838 Other surgical procedures as the cause of abnormal reaction of the patient, or of later complication, without mention of misadventure at the time of the procedure: Secondary | ICD-10-CM | POA: Insufficient documentation

## 2017-01-09 LAB — URINALYSIS, ROUTINE W REFLEX MICROSCOPIC

## 2017-01-09 LAB — URINALYSIS, MICROSCOPIC (REFLEX)

## 2017-01-09 LAB — POCT PREGNANCY, URINE: PREG TEST UR: NEGATIVE

## 2017-01-09 NOTE — MAU Note (Signed)
Has a skylar IUD, placed 4 months ago.  Been having things like breast tenderness, abd tightening,  Nausea,  Today started passing some blood clots, but period is not heavy enough to fill up a pad.d

## 2017-01-09 NOTE — MAU Note (Signed)
Chief Complaint: Abdominal Pain; breast tenderness; Nausea; and Vaginal Bleeding   None    SUBJECTIVE HPI: Linda Curry is a 21 y.o. G1P0010 at Unknown who presents to Maternity Admissions reporting vaginal bleeding with some small clots, breast tenderness and nausea.  Pt had a Skyla IUD placed 4 months ago.  Has had bleeding.  Concerned that she is pregnant.  No concerns about her partner.  Has hx of one SAB.  Location: vaginal bleeding Quality: mod bleeding Severity: 4/10 on pain scale Duration: 2 days Associated signs and symptoms: nausea, breast tenderness, nausea  Past Medical History:  Diagnosis Date  . Infection    UTI   OB History  Gravida Para Term Preterm AB Living  1       1    SAB TAB Ectopic Multiple Live Births  1            # Outcome Date GA Lbr Len/2nd Weight Sex Delivery Anes PTL Lv  1 SAB              Past Surgical History:  Procedure Laterality Date  . NO PAST SURGERIES     Social History   Social History  . Marital status: Married    Spouse name: N/A  . Number of children: N/A  . Years of education: N/A   Occupational History  . Not on file.   Social History Main Topics  . Smoking status: Never Smoker  . Smokeless tobacco: Never Used  . Alcohol use No  . Drug use: No  . Sexual activity: Yes   Other Topics Concern  . Not on file   Social History Narrative  . No narrative on file   Family History  Problem Relation Age of Onset  . Asthma Mother   . Cancer Maternal Grandmother        pancreatic   No current facility-administered medications on file prior to encounter.    Current Outpatient Prescriptions on File Prior to Encounter  Medication Sig Dispense Refill  . budesonide (RHINOCORT AQUA) 32 MCG/ACT nasal spray Place 2 sprays into both nostrils daily. 8.43 mL 0  . cephALEXin (KEFLEX) 500 MG capsule Take 500 mg by mouth 3 (three) times daily.    . cetirizine (ZYRTEC) 10 MG tablet Take 1 tablet (10 mg total) by mouth daily. 30  tablet 0   No Known Allergies  I have reviewed patient's Past Medical Hx, Surgical Hx, Family Hx, Social Hx, medications and allergies.   Review of Systems  Constitutional: Negative.   HENT: Negative.   Eyes: Negative.   Respiratory: Negative.   Cardiovascular: Negative.   Gastrointestinal: Positive for abdominal pain and nausea.  Endocrine: Negative.   Genitourinary: Positive for vaginal bleeding.  Musculoskeletal: Negative.   Allergic/Immunologic: Negative.   Neurological: Negative.   Hematological: Negative.   Psychiatric/Behavioral: Negative.     OBJECTIVE Patient Vitals for the past 24 hrs:  BP Temp Temp src Pulse Resp Weight  01/09/17 1755 115/62 98.5 F (36.9 C) Oral 73 16 57.7 kg (127 lb 4 oz)   Constitutional: Well-developed, well-nourished female in no acute distress.  Cardiovascular: normal rate Respiratory: normal rate and effort.  GI: Abd soft, non-tender, gravid appropriate for gestational age.  MS: Extremities nontender, no edema, normal ROM Neurologic: Alert and oriented x 4.  GU: Neg CVAT.  SPECULUM EXAM: NEFG, physiologic discharge, mod amount blood noted, cervix no lesions, IUD strings visualized and 2 inches long.  BIMANUAL: cervix LTc; uterus normal size, no adnexal  tenderness or masses.  No CMT.  LAB RESULTS Results for orders placed or performed during the hospital encounter of 01/09/17 (from the past 24 hour(s))  Urinalysis, Routine w reflex microscopic     Status: Abnormal   Collection Time: 01/09/17  5:58 PM  Result Value Ref Range   Color, Urine RED (A) YELLOW   APPearance TURBID (A) CLEAR   Specific Gravity, Urine  1.005 - 1.030    TEST NOT REPORTED DUE TO COLOR INTERFERENCE OF URINE PIGMENT   pH  5.0 - 8.0    TEST NOT REPORTED DUE TO COLOR INTERFERENCE OF URINE PIGMENT   Glucose, UA (A) NEGATIVE mg/dL    TEST NOT REPORTED DUE TO COLOR INTERFERENCE OF URINE PIGMENT   Hgb urine dipstick (A) NEGATIVE    TEST NOT REPORTED DUE TO COLOR  INTERFERENCE OF URINE PIGMENT   Bilirubin Urine (A) NEGATIVE    TEST NOT REPORTED DUE TO COLOR INTERFERENCE OF URINE PIGMENT   Ketones, ur (A) NEGATIVE mg/dL    TEST NOT REPORTED DUE TO COLOR INTERFERENCE OF URINE PIGMENT   Protein, ur (A) NEGATIVE mg/dL    TEST NOT REPORTED DUE TO COLOR INTERFERENCE OF URINE PIGMENT   Nitrite (A) NEGATIVE    TEST NOT REPORTED DUE TO COLOR INTERFERENCE OF URINE PIGMENT   Leukocytes, UA (A) NEGATIVE    TEST NOT REPORTED DUE TO COLOR INTERFERENCE OF URINE PIGMENT  Urinalysis, Microscopic (reflex)     Status: Abnormal   Collection Time: 01/09/17  5:58 PM  Result Value Ref Range   RBC / HPF TOO NUMEROUS TO COUNT 0 - 5 RBC/hpf   WBC, UA 0-5 0 - 5 WBC/hpf   Bacteria, UA MANY (A) NONE SEEN   Squamous Epithelial / LPF 0-5 (A) NONE SEEN   Urine-Other MICROSCOPIC EXAM PERFORMED ON UNCONCENTRATED URINE   Pregnancy, urine POC     Status: None   Collection Time: 01/09/17  6:12 PM  Result Value Ref Range   Preg Test, Ur NEGATIVE NEGATIVE   Wet mount negative clue, trich or yeast IMAGING None  MAU COURSE Orders Placed This Encounter  Procedures  . Urinalysis, Routine w reflex microscopic  . Urinalysis, Microscopic (reflex)  . Diet - low sodium heart healthy  . Pregnancy, urine POC  . Discharge patient Discharge disposition: 01-Home or Self Care; Discharge patient date: 01/09/2017   No orders of the defined types were placed in this encounter.   MDM PE, PELVIC, Wet Mount, Preg test ASSESSMENT 1. Complication of intrauterine device (IUD), unspecified complication, initial encounter [facility](HCC)     PLAN Discussed common discomforts of IUD and side effects.  Discussed with pt may need US to evaluate placement.  Due to small amount of bleeding can follow up in office for US.  Discussed using back up method until US done. Discharge home in stable condition. No precautions Follow-up Information    [facility], [facility] Follow up in 1 week(s).    Specialty:  Obstetrics and Gynecology Why:  Need US for IUD placement Contact information: [address] [place] [address] [phone]          Allergies as of 01/09/2017   No Known Allergies     Medication List    TAKE these medications   budesonide 32 MCG/ACT nasal spray Commonly known as:  RHINOCORT AQUA Place 2 sprays into both nostrils daily.   cephALEXin 500 MG capsule Commonly known as:  KEFLEX Take 500 mg by mouth 3 (  three) times daily.   cetirizine 10 MG tablet Commonly known as:  ZYRTEC Take 1 tablet (10 mg total) by mouth daily.            Discharge Care Instructions        Start     Ordered   01/09/17 0000  Diet - low sodium heart healthy     01/09/17 1958       Kenney Houseman, CNM 01/09/2017  7:59 PM

## 2017-09-10 DIAGNOSIS — N915 Oligomenorrhea, unspecified: Secondary | ICD-10-CM | POA: Diagnosis not present

## 2017-10-16 ENCOUNTER — Other Ambulatory Visit: Payer: Self-pay | Admitting: Obstetrics and Gynecology

## 2017-10-16 ENCOUNTER — Other Ambulatory Visit (HOSPITAL_COMMUNITY)
Admission: RE | Admit: 2017-10-16 | Discharge: 2017-10-16 | Disposition: A | Discharge status: Home / Self Care | Payer: BLUE CROSS/BLUE SHIELD | Source: Ambulatory Visit | Attending: Obstetrics and Gynecology | Admitting: Obstetrics and Gynecology

## 2017-10-16 DIAGNOSIS — Z01419 Encounter for gynecological examination (general) (routine) without abnormal findings: Secondary | ICD-10-CM | POA: Diagnosis not present

## 2017-10-16 DIAGNOSIS — Z124 Encounter for screening for malignant neoplasm of cervix: Secondary | ICD-10-CM | POA: Diagnosis not present

## 2017-10-16 DIAGNOSIS — Z01411 Encounter for gynecological examination (general) (routine) with abnormal findings: Secondary | ICD-10-CM | POA: Insufficient documentation

## 2017-10-16 DIAGNOSIS — N930 Postcoital and contact bleeding: Secondary | ICD-10-CM | POA: Diagnosis not present

## 2017-10-16 DIAGNOSIS — Z113 Encounter for screening for infections with a predominantly sexual mode of transmission: Secondary | ICD-10-CM | POA: Diagnosis not present

## 2017-10-16 DIAGNOSIS — Z30011 Encounter for initial prescription of contraceptive pills: Secondary | ICD-10-CM | POA: Diagnosis not present

## 2017-10-19 LAB — CYTOLOGY - PAP
CHLAMYDIA, DNA PROBE: NEGATIVE
HPV (WINDOPATH): DETECTED — AB
NEISSERIA GONORRHEA: NEGATIVE

## 2017-10-22 DIAGNOSIS — K922 Gastrointestinal hemorrhage, unspecified: Secondary | ICD-10-CM | POA: Diagnosis not present

## 2017-10-22 DIAGNOSIS — K602 Anal fissure, unspecified: Secondary | ICD-10-CM | POA: Diagnosis not present

## 2017-10-22 DIAGNOSIS — K5900 Constipation, unspecified: Secondary | ICD-10-CM | POA: Diagnosis not present

## 2018-02-06 DIAGNOSIS — N898 Other specified noninflammatory disorders of vagina: Secondary | ICD-10-CM | POA: Diagnosis not present

## 2018-02-06 DIAGNOSIS — R300 Dysuria: Secondary | ICD-10-CM | POA: Diagnosis not present

## 2018-04-08 DIAGNOSIS — R1084 Generalized abdominal pain: Secondary | ICD-10-CM | POA: Diagnosis not present

## 2018-05-21 DIAGNOSIS — Z23 Encounter for immunization: Secondary | ICD-10-CM | POA: Diagnosis not present

## 2018-05-21 DIAGNOSIS — K5900 Constipation, unspecified: Secondary | ICD-10-CM | POA: Diagnosis not present

## 2018-07-26 DIAGNOSIS — J029 Acute pharyngitis, unspecified: Secondary | ICD-10-CM | POA: Diagnosis not present

## 2018-09-04 DIAGNOSIS — J069 Acute upper respiratory infection, unspecified: Secondary | ICD-10-CM | POA: Diagnosis not present

## 2018-10-18 ENCOUNTER — Other Ambulatory Visit: Payer: Self-pay | Admitting: Obstetrics and Gynecology

## 2018-10-18 ENCOUNTER — Other Ambulatory Visit (HOSPITAL_COMMUNITY)
Admission: RE | Admit: 2018-10-18 | Discharge: 2018-10-18 | Disposition: A | Discharge status: Home / Self Care | Payer: BLUE CROSS/BLUE SHIELD | Source: Ambulatory Visit | Attending: Obstetrics and Gynecology | Admitting: Obstetrics and Gynecology

## 2018-10-18 DIAGNOSIS — Z01419 Encounter for gynecological examination (general) (routine) without abnormal findings: Secondary | ICD-10-CM | POA: Insufficient documentation

## 2018-10-22 LAB — CYTOLOGY - PAP
Chlamydia: NEGATIVE
Diagnosis: NEGATIVE
Neisseria Gonorrhea: NEGATIVE

## 2018-11-19 ENCOUNTER — Other Ambulatory Visit: Payer: Self-pay | Admitting: *Deleted

## 2018-11-19 DIAGNOSIS — Z20822 Contact with and (suspected) exposure to covid-19: Secondary | ICD-10-CM

## 2018-11-23 LAB — NOVEL CORONAVIRUS, NAA: SARS-CoV-2, NAA: NOT DETECTED

## 2018-12-03 LAB — OB RESULTS CONSOLE GC/CHLAMYDIA
Chlamydia: NEGATIVE
Gonorrhea: NEGATIVE

## 2018-12-03 LAB — OB RESULTS CONSOLE RUBELLA ANTIBODY, IGM: Rubella: IMMUNE

## 2018-12-03 LAB — OB RESULTS CONSOLE RPR: RPR: NONREACTIVE

## 2018-12-03 LAB — OB RESULTS CONSOLE ABO/RH: RH Type: POSITIVE

## 2018-12-03 LAB — OB RESULTS CONSOLE HIV ANTIBODY (ROUTINE TESTING): HIV: NONREACTIVE

## 2018-12-03 LAB — OB RESULTS CONSOLE ANTIBODY SCREEN: Antibody Screen: NEGATIVE

## 2018-12-03 LAB — OB RESULTS CONSOLE HEPATITIS B SURFACE ANTIGEN: Hepatitis B Surface Ag: NEGATIVE

## 2018-12-24 ENCOUNTER — Other Ambulatory Visit: Payer: Self-pay

## 2018-12-24 ENCOUNTER — Emergency Department (HOSPITAL_COMMUNITY)
Admission: EM | Admit: 2018-12-24 | Discharge: 2018-12-24 | Disposition: A | Discharge status: Home / Self Care | Payer: 59 | Attending: Emergency Medicine | Admitting: Emergency Medicine

## 2018-12-24 ENCOUNTER — Encounter (HOSPITAL_COMMUNITY): Payer: Self-pay | Admitting: Emergency Medicine

## 2018-12-24 DIAGNOSIS — T7840XA Allergy, unspecified, initial encounter: Secondary | ICD-10-CM | POA: Diagnosis not present

## 2018-12-24 DIAGNOSIS — Z79899 Other long term (current) drug therapy: Secondary | ICD-10-CM | POA: Diagnosis not present

## 2018-12-24 MED ORDER — FAMOTIDINE 20 MG PO TABS: 20.0000 mg | ORAL_TABLET | Freq: Two times a day (BID) | ORAL | 0 refills | Status: AC

## 2018-12-24 MED ORDER — DIPHENHYDRAMINE HCL 25 MG PO CAPS
50.0000 mg | ORAL_CAPSULE | Freq: Once | ORAL | Status: AC
  Administered 2018-12-24: 50 mg via ORAL
  Filled 2018-12-24: qty 2

## 2018-12-24 MED ORDER — DIPHENHYDRAMINE HCL 25 MG PO TABS: 25.0000 mg | ORAL_TABLET | Freq: Four times a day (QID) | ORAL | 0 refills | Status: AC | PRN

## 2018-12-24 MED ORDER — FAMOTIDINE 20 MG PO TABS
20.0000 mg | ORAL_TABLET | Freq: Once | ORAL | Status: AC
  Administered 2018-12-24: 14:00:00 20 mg via ORAL
  Filled 2018-12-24: qty 1

## 2018-12-24 MED ORDER — EPINEPHRINE 0.3 MG/0.3ML IJ SOAJ: 0.3000 mg | INTRAMUSCULAR | 2 refills | Status: AC | PRN

## 2018-12-24 NOTE — Discharge Instructions (Addendum)
Allergic Reaction Instructions:  Benadryl: Take 25 mg of Benadryl every 6 hours for the next 24 hours.  Use caution as Benadryl can make you drowsy. Pepcid: Take the Pepcid, as prescribed, over the next 3 days. EpiPen: You have been prescribed an EpiPen to be used in the case of anaphylaxis. Please see the attached sheets regarding the symptoms that would cause you to have to use the EpiPen. If you have to use the EpiPen, you should always come to the emergency room immediately.  Follow-up with your primary care provider on this matter.  Allergy testing with an allergist may be warranted.  Return to the ED for worsening symptoms, shortness of breath, chest pain, palpitations, persistent vomiting, facial or throat swelling, or any other major concerns.

## 2018-12-24 NOTE — ED Triage Notes (Signed)
Pt here for evaluation of allergic reaction to banana which has never happened to her before. Pt ate a banana and her mouth began to swell and arm began to tingle. Airway intact, NAD. Took Benadryl PTA.

## 2018-12-24 NOTE — ED Provider Notes (Signed)
[facility] EMERGENCY DEPARTMENT Provider Note   CSN: [id number] Arrival date & time: 12/24/18  1315     History   Chief Complaint No chief complaint on file.   HPI Linda Curry is a 24 y.o. female.     HPI   Linda Curry is a 23 y.o. female, with a history of G3, P2, presenting to the ED with concern for allergic reaction.  She states she ate a banana around 12 PM.  Within about 5 minutes she began to have tingling and itching in her arms, followed by itching and sensation of swelling in the throat.  She also notes mild shortness of breath, chest tightness, and nausea. She has eaten bananas before without any adverse reaction. LMP June 27.  Patient is currently [redacted] weeks pregnant. Denies taking any medications prior to arrival. Denies fever, syncope, dizziness, drooling, cough, wheezing, vomiting, diarrhea, abdominal pain, facial swelling, tongue swelling, rash, or any other complaints.     Past Medical History:  Diagnosis Date  . Infection    UTI    There are no active problems to display for this patient.   Past Surgical History:  Procedure Laterality Date  . NO PAST SURGERIES       OB History    Gravida  3   Para      Term      Preterm      AB  1   Living        SAB  1   TAB      Ectopic      Multiple      Live Births               Home Medications    Prior to Admission medications   Medication Sig Start Date End Date Taking? Authorizing Provider  Pyridoxine HCl (B-6 PO) Take 1 tablet by mouth at bedtime.   Yes [provider]  budesonide (RHINOCORT AQUA) 32 MCG/ACT nasal spray Place 2 sprays into both nostrils daily. Patient not taking: Reported on 01/09/2017 05/02/16   Etta Quill, NP  cetirizine (ZYRTEC) 10 MG tablet Take 1 tablet (10 mg total) by mouth daily. Patient not taking: Reported on 01/09/2017 05/02/16   Etta Quill, NP  diphenhydrAMINE (BENADRYL) 25 MG tablet Take 1 tablet (25 mg total)  by mouth every 6 (six) hours as needed for up to 1 day. 12/24/18 12/25/18  Iosefa Weintraub C, PA-C  EPINEPHrine 0.3 mg/0.3 mL IJ SOAJ injection Inject 0.3 mLs (0.3 mg total) into the muscle as needed for anaphylaxis. 12/24/18   Crandall Harvel C, PA-C  famotidine (PEPCID) 20 MG tablet Take 1 tablet (20 mg total) by mouth 2 (two) times daily for 3 days. 12/24/18 12/27/18  Lorayne Bender, PA-C    Family History Family History  Problem Relation Age of Onset  . Asthma Mother   . Cancer Maternal Grandmother        pancreatic    Social History Social History   Tobacco Use  . Smoking status: Never Smoker  . Smokeless tobacco: Never Used  Substance Use Topics  . Alcohol use: No  . Drug use: No     Allergies   Banana   Review of Systems Review of Systems  Constitutional: Negative for diaphoresis and fever.  HENT: Negative for drooling, facial swelling and trouble swallowing.        Sensation of throat swelling and itching  Respiratory: Positive for chest tightness and shortness  of breath. Negative for cough and wheezing.   Cardiovascular: Negative for leg swelling.  Gastrointestinal: Positive for nausea. Negative for abdominal pain, diarrhea and vomiting.  Skin: Negative for rash.  Neurological: Negative for syncope.  All other systems reviewed and are negative.    Physical Exam Updated Vital Signs BP 128/69   Pulse 89   Temp 98.7 F (37.1 C) (Oral)   Resp 16   SpO2 100%   Physical Exam Vitals signs and nursing note reviewed.  Constitutional:      General: She is not in acute distress.    Appearance: She is well-developed. She is not diaphoretic.  HENT:     Head: Normocephalic and atraumatic.     Mouth/Throat:     Mouth: Mucous membranes are moist.     Pharynx: Oropharynx is clear.     Comments: No noted intraoral swelling.  No trismus or noted abnormal phonation.  Mouth opening to at least 3 finger widths.  Handles oral secretions without difficulty.  No noted facial swelling.   No lingual or sublingual swelling.  No swelling or tenderness to the submental or submandibular regions.  No swelling or tenderness into the soft tissues of the neck. Eyes:     Conjunctiva/sclera: Conjunctivae normal.  Neck:     Musculoskeletal: Neck supple.  Cardiovascular:     Rate and Rhythm: Normal rate and regular rhythm.     Pulses: Normal pulses.          Radial pulses are 2+ on the right side and 2+ on the left side.     Heart sounds: Normal heart sounds.     Comments: Tactile temperature in the extremities appropriate and equal bilaterally. Pulmonary:     Effort: Pulmonary effort is normal. No respiratory distress.     Breath sounds: Normal breath sounds.     Comments: No increased work of breathing.  Speaks in full sentences without difficulty. Abdominal:     Palpations: Abdomen is soft.     Tenderness: There is no abdominal tenderness. There is no guarding.  Musculoskeletal:     Right lower leg: No edema.     Left lower leg: No edema.  Lymphadenopathy:     Cervical: No cervical adenopathy.  Skin:    General: Skin is warm and dry.     Findings: No rash.     Comments: No noted hives or other acute skin lesions.  Neurological:     Mental Status: She is alert.  Psychiatric:        Mood and Affect: Mood and affect normal.        Speech: Speech normal.        Behavior: Behavior normal.      ED Treatments / Results  Labs (all labs ordered are listed, but only abnormal results are displayed) Labs Reviewed - No data to display  EKG None  Radiology No results found.  Procedures Procedures (including critical care time)  Medications Ordered in ED Medications  diphenhydrAMINE (BENADRYL) capsule 50 mg (50 mg Oral Given 12/24/18 1345)  famotidine (PEPCID) tablet 20 mg (20 mg Oral Given 12/24/18 1346)     Initial Impression / Assessment and Plan / ED Course  I have reviewed the triage vital signs and the nursing notes.  Pertinent labs & imaging results that were  available during my care of the patient were reviewed by me and considered in my medical decision making (see chart for details).  Clinical Course as of Dec 23 1620  Tue Dec 24, 2018  1340 Spoke with Carney Bern, ED pharmacist, to confirm safe medications for this pregnant patient with possible allergic reaction.  She advises Benadryl is safe during pregnancy and in the first trimester Pepcid is also acceptable.  Avoid steroids.   [SJ]    Clinical Course User Index [SJ] Neziah Braley C, PA-C       Patient presents with symptoms that may represent allergic reaction.  My suspicion for anaphylaxis is quite low.  She improved over her ED course without onset of additional symptoms. She has been advised to follow-up with her PCP and an allergist on this matter. We also discussed situations for which she would need to use an EpiPen. The patient was given instructions for home care as well as return precautions. Patient voices understanding of these instructions, accepts the plan, and is comfortable with discharge.     Vitals:   12/24/18 1527 12/24/18 1530 12/24/18 1600 12/24/18 1614  BP:  117/69 113/70 113/70  Pulse: 89 81 85 90  Resp:    16  Temp:      TempSrc:      SpO2: 100% 100% 100% 100%     Final Clinical Impressions(s) / ED Diagnoses   Final diagnoses:  Allergic reaction, initial encounter    ED Discharge Orders         Ordered    diphenhydrAMINE (BENADRYL) 25 MG tablet  Every 6 hours PRN     12/24/18 1607    famotidine (PEPCID) 20 MG tablet  2 times daily     12/24/18 1607    EPINEPHrine 0.3 mg/0.3 mL IJ SOAJ injection  As needed     12/24/18 1607           Anselm Pancoast, PA-C 12/24/18 1627    Mancel Bale, MD 12/26/18 1726

## 2018-12-24 NOTE — ED Notes (Signed)
Pt given cup of water to drink. Approved per [place], [place]

## 2019-01-28 ENCOUNTER — Inpatient Hospital Stay (HOSPITAL_COMMUNITY)
Admission: AD | Admit: 2019-01-28 | Discharge: 2019-01-28 | Disposition: A | Discharge status: Home / Self Care | Payer: 59 | Attending: Obstetrics and Gynecology | Admitting: Obstetrics and Gynecology

## 2019-01-28 ENCOUNTER — Other Ambulatory Visit: Payer: Self-pay

## 2019-01-28 ENCOUNTER — Encounter (HOSPITAL_COMMUNITY): Payer: Self-pay | Admitting: *Deleted

## 2019-01-28 ENCOUNTER — Inpatient Hospital Stay (HOSPITAL_COMMUNITY): Payer: 59

## 2019-01-28 DIAGNOSIS — Z79899 Other long term (current) drug therapy: Secondary | ICD-10-CM | POA: Insufficient documentation

## 2019-01-28 DIAGNOSIS — R1032 Left lower quadrant pain: Secondary | ICD-10-CM | POA: Diagnosis not present

## 2019-01-28 DIAGNOSIS — O209 Hemorrhage in early pregnancy, unspecified: Secondary | ICD-10-CM | POA: Diagnosis not present

## 2019-01-28 DIAGNOSIS — O2341 Unspecified infection of urinary tract in pregnancy, first trimester: Secondary | ICD-10-CM | POA: Diagnosis not present

## 2019-01-28 DIAGNOSIS — O26891 Other specified pregnancy related conditions, first trimester: Secondary | ICD-10-CM | POA: Diagnosis not present

## 2019-01-28 DIAGNOSIS — Z3A13 13 weeks gestation of pregnancy: Secondary | ICD-10-CM | POA: Diagnosis not present

## 2019-01-28 LAB — CBC WITH DIFFERENTIAL/PLATELET
Abs Immature Granulocytes: 0.03 10*3/uL (ref 0.00–0.07)
Basophils Absolute: 0 10*3/uL (ref 0.0–0.1)
Basophils Relative: 0 %
Eosinophils Absolute: 0.2 10*3/uL (ref 0.0–0.5)
Eosinophils Relative: 2 %
HCT: 34.4 % — ABNORMAL LOW (ref 36.0–46.0)
Hemoglobin: 11.7 g/dL — ABNORMAL LOW (ref 12.0–15.0)
Immature Granulocytes: 0 %
Lymphocytes Relative: 19 %
Lymphs Abs: 1.7 10*3/uL (ref 0.7–4.0)
MCH: 27.3 pg (ref 26.0–34.0)
MCHC: 34 g/dL (ref 30.0–36.0)
MCV: 80.2 fL (ref 80.0–100.0)
Monocytes Absolute: 0.8 10*3/uL (ref 0.1–1.0)
Monocytes Relative: 9 %
Neutro Abs: 6.3 10*3/uL (ref 1.7–7.7)
Neutrophils Relative %: 70 %
Platelets: 241 10*3/uL (ref 150–400)
RBC: 4.29 MIL/uL (ref 3.87–5.11)
RDW: 13.2 % (ref 11.5–15.5)
WBC: 9.1 10*3/uL (ref 4.0–10.5)
nRBC: 0 % (ref 0.0–0.2)

## 2019-01-28 LAB — URINALYSIS, ROUTINE W REFLEX MICROSCOPIC
Bilirubin Urine: NEGATIVE
Glucose, UA: NEGATIVE mg/dL
Ketones, ur: 80 mg/dL — AB
Leukocytes,Ua: NEGATIVE
Nitrite: NEGATIVE
Protein, ur: 30 mg/dL — AB
Specific Gravity, Urine: 1.025 (ref 1.005–1.030)
pH: 6 (ref 5.0–8.0)

## 2019-01-28 LAB — WET PREP, GENITAL
Clue Cells Wet Prep HPF POC: NONE SEEN
Sperm: NONE SEEN
Trich, Wet Prep: NONE SEEN
Yeast Wet Prep HPF POC: NONE SEEN

## 2019-01-28 IMAGING — US US OB COMP LESS 14 WK
1 series · 15 of 28 positions shown · non-contrast
Comparison: [DATE]

CLINICAL DATA: Vaginal bleeding.

EXAM:
OBSTETRIC <14 WK ULTRASOUND
TECHNIQUE: Transabdominal ultrasound was performed for evaluation of the
gestation as well as the maternal uterus and adnexal regions.

[Series 1: us ob comp less 14 wk · 15 of 41 slices shown]
[im 1/41]
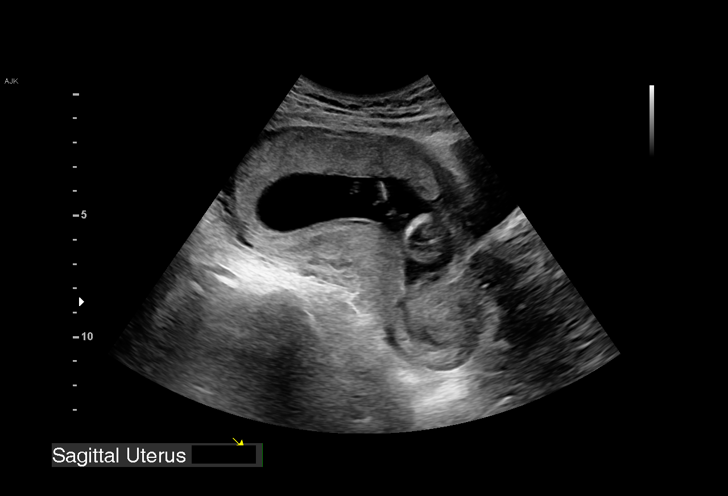
[im 3/41]
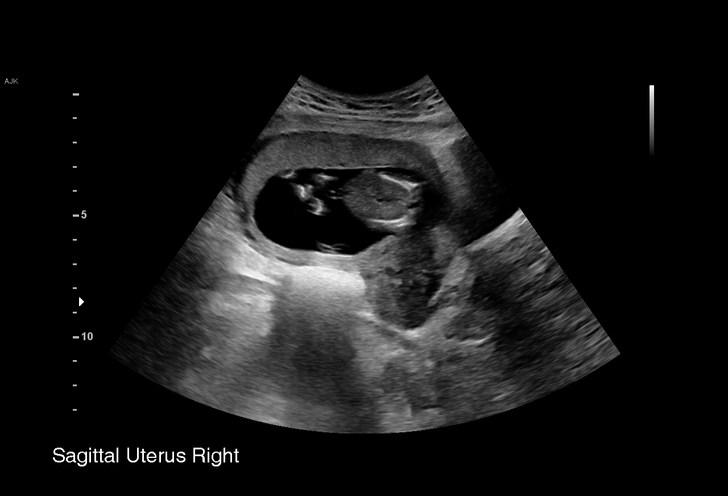
[im 6/41]
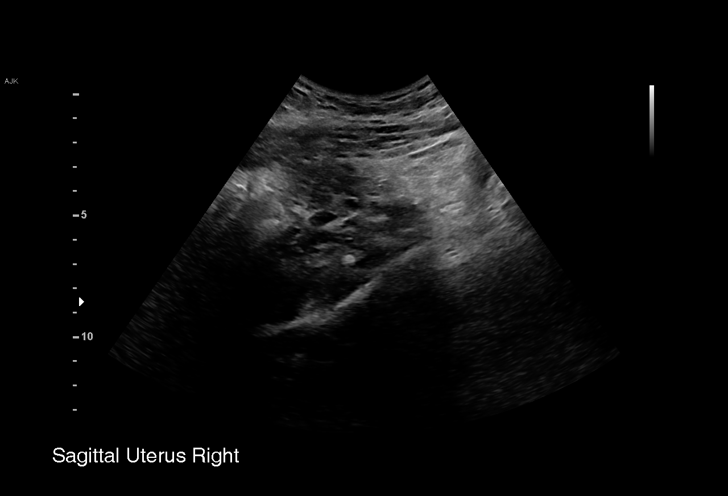
[im 9/41]
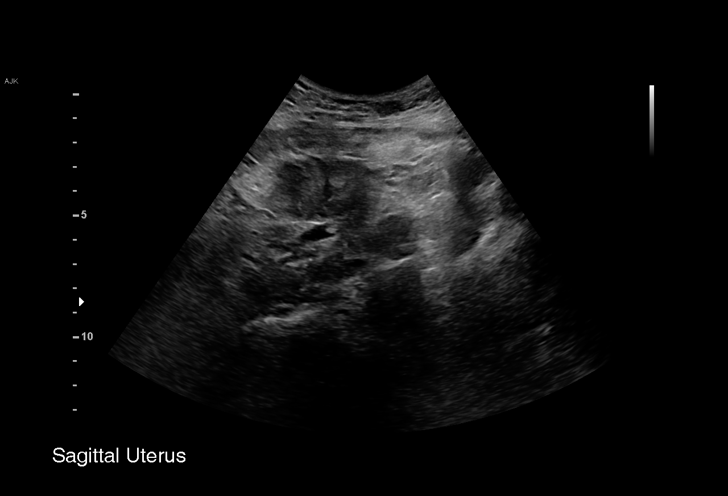
[im 12/41]
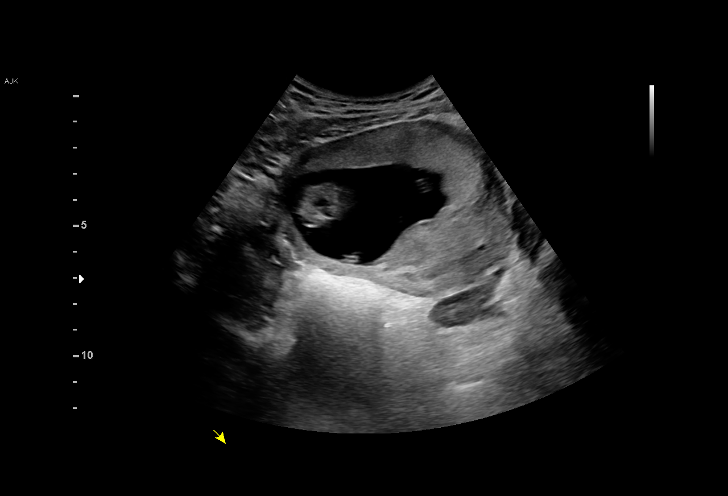
[im 15/41]
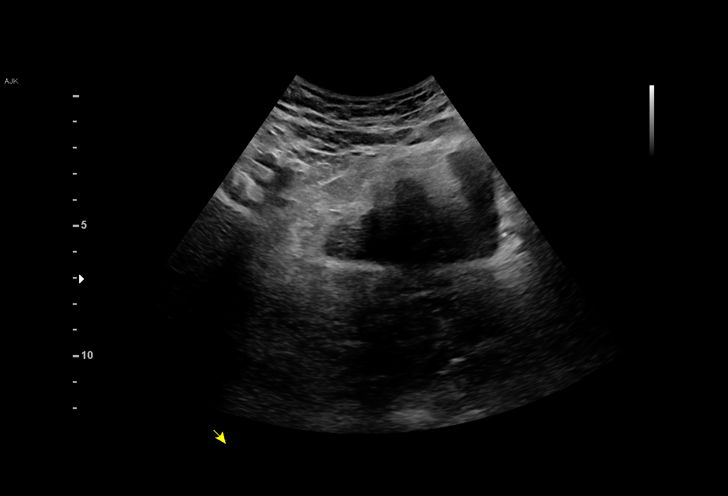
[im 18/41]
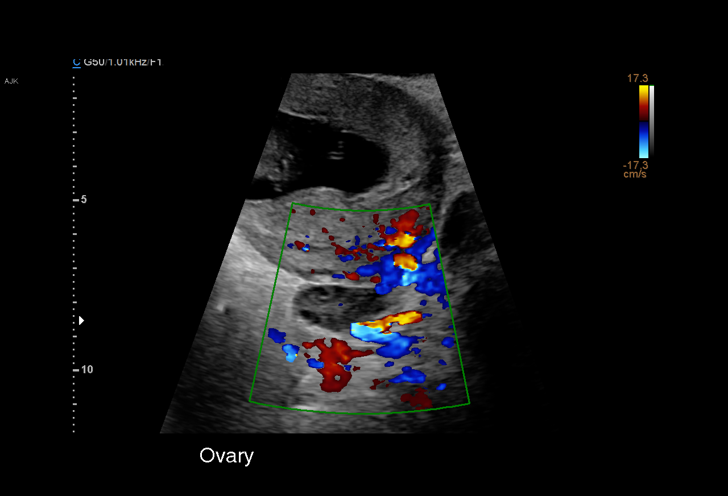
[im 21/41]
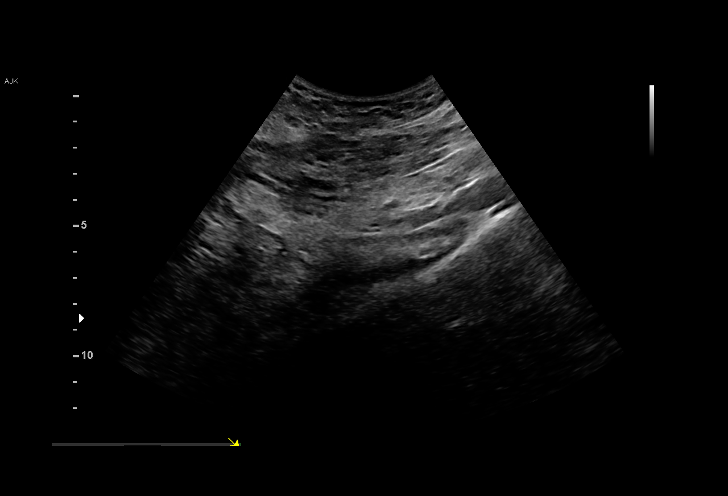
[im 23/41]
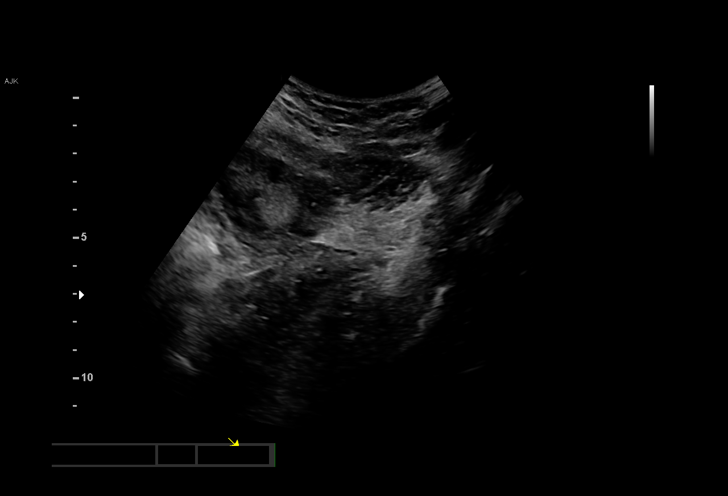
[im 26/41]
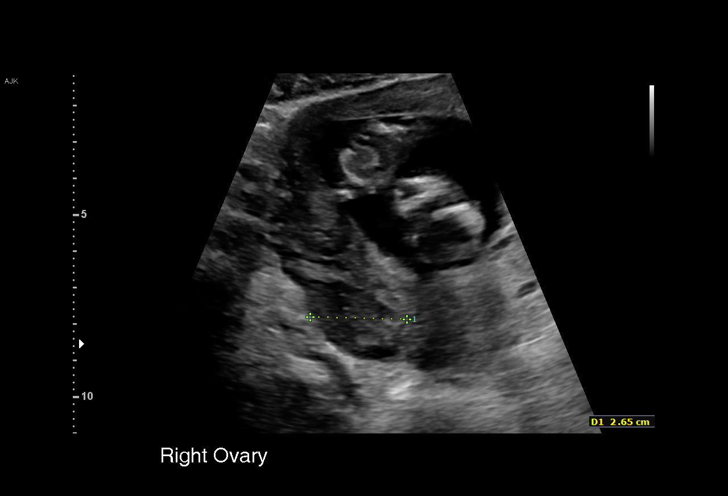
[im 29/41]
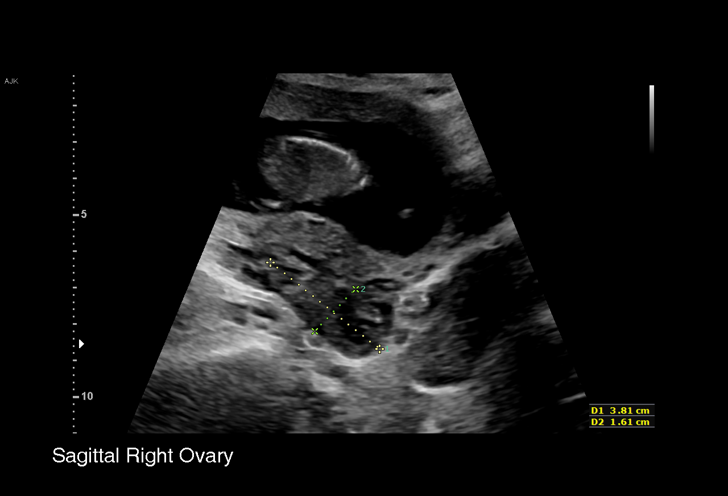
[im 32/41]
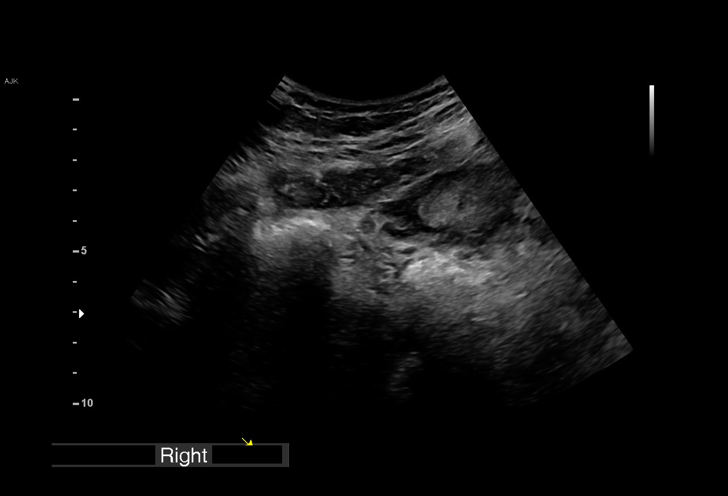
[im 35/41]
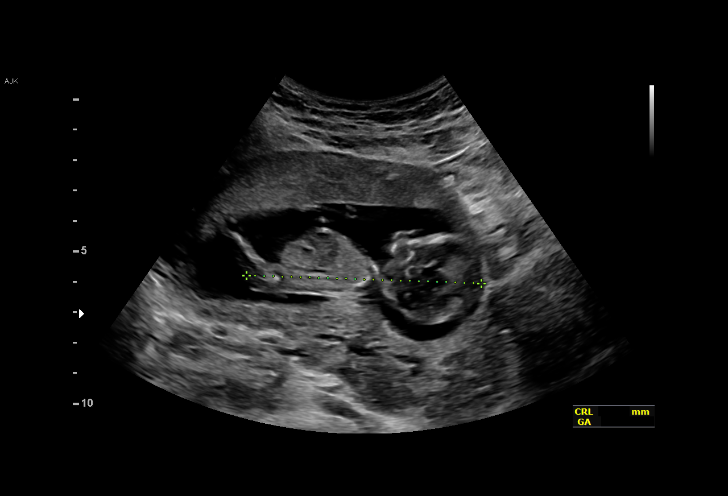
[im 38/41]
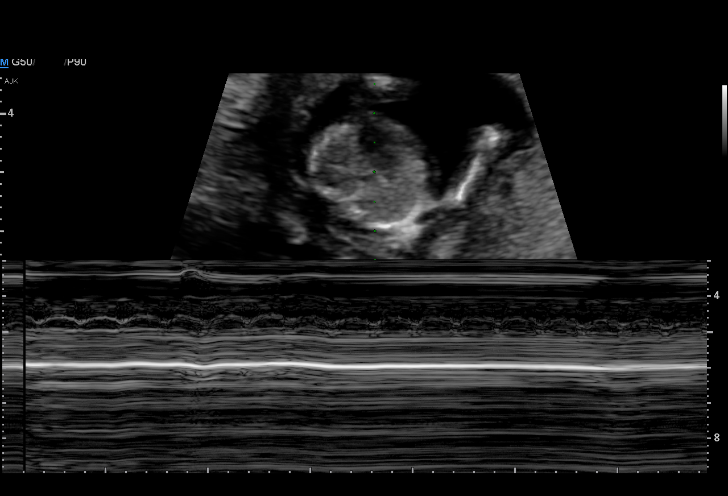
[im 41/41]
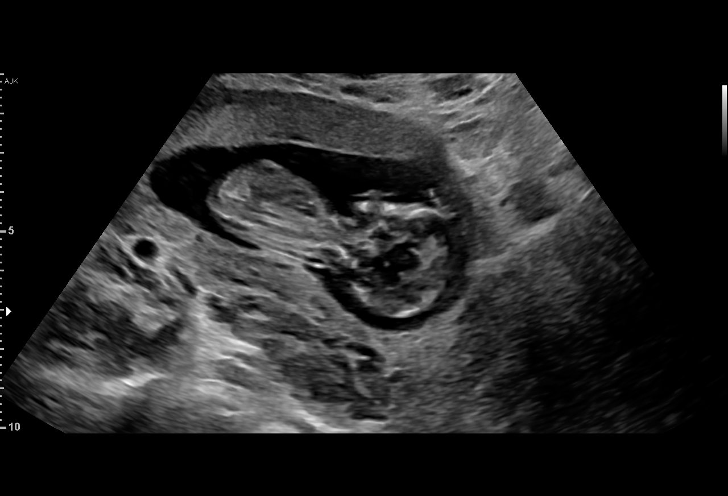

[15 of 28 positions shown; findings below may reference images not displayed]

FINDINGS: Intrauterine gestational sac: Single

Yolk sac:  No

Embryo:  Yes

Cardiac Activity: Yes

Heart Rate: 146 bpm

CRL:   75.4 mm   13 w 4 d                  US EDC: [DATE]

Subchorionic hemorrhage:  None visualized.

Subchorionic hemorrhage: None

Right ovary: Normal

Left ovary: Normal

Other :None

Free fluid:  None
IMPRESSION: 1. Single living intrauterine gestation with an estimated
gestational age of 13 weeks and 4 days. No complications.

## 2019-01-28 MED ORDER — NITROFURANTOIN MONOHYD MACRO 100 MG PO CAPS: 100.0000 mg | ORAL_CAPSULE | Freq: Two times a day (BID) | ORAL | 0 refills | Status: DC

## 2019-01-28 NOTE — Discharge Instructions (Signed)
Vaginal Bleeding During Pregnancy, First Trimester ° °A small amount of bleeding from the vagina (spotting) is relatively common during early pregnancy. It usually stops on its own. Various things may cause bleeding or spotting during early pregnancy. Some bleeding may be related to the pregnancy, and some may not. In many cases, the bleeding is normal and is not a problem. However, bleeding can also be a sign of something serious. Be sure to tell your health care provider about any vaginal bleeding right away. °Some possible causes of vaginal bleeding during the first trimester include: °· Infection or inflammation of the cervix. °· Growths (polyps) on the cervix. °· Miscarriage or threatened miscarriage. °· Pregnancy tissue developing outside of the uterus (ectopic pregnancy). °· A mass of tissue developing in the uterus due to an egg being fertilized incorrectly (molar pregnancy). °Follow these instructions at home: °Activity °· Follow instructions from your health care provider about limiting your activity. Ask what activities are safe for you. °· If needed, make plans for someone to help with your regular activities. °· Do not have sex or orgasms until your health care provider says that this is safe. °General instructions °· Take over-the-counter and prescription medicines only as told by your health care provider. °· Pay attention to any changes in your symptoms. °· Do not use tampons or douche. °· Write down how many pads you use each day, how often you change pads, and how soaked (saturated) they are. °· If you pass any tissue from your vagina, save the tissue so you can show it to your health care provider. °· Keep all follow-up visits as told by your health care provider. This is important. °Contact a health care provider if: °· You have vaginal bleeding during any part of your pregnancy. °· You have cramps or labor pains. °· You have a fever. °Get help right away if: °· You have severe cramps in your  back or abdomen. °· You pass large clots or a large amount of tissue from your vagina. °· Your bleeding increases. °· You feel light-headed or weak, or you faint. °· You have chills. °· You are leaking fluid or have a gush of fluid from your vagina. °Summary °· A small amount of bleeding (spotting) from the vagina is relatively common during early pregnancy. °· Various things may cause bleeding or spotting in early pregnancy. °· Be sure to tell your health care provider about any vaginal bleeding right away. °This information is not intended to replace advice given to you by your health care provider. Make sure you discuss any questions you have with your health care provider. °Document Released: 01/25/2005 Document Revised: 08/06/2018 Document Reviewed: 07/20/2016 °Elsevier Patient Education © 2020 Elsevier Inc. ° °

## 2019-01-28 NOTE — MAU Provider Note (Signed)
Chief Complaint: Vaginal Bleeding and Abdominal Pain   First Provider Initiated Contact with Patient 01/28/19 1315     SUBJECTIVE HPI: Linda Curry is a 23 y.o. G2P0010 at [redacted]w[redacted]d who presents to Maternity Admissions reporting vaginal bleeding since yesterday and LLQ abd pain 7/10 on pain scale x 3 days. Was seen at [facility]. Reports [place] at 6 weeks, but no records of [place] available. Reports that she had pelvic exam and STD testing 01/24/19. No recent IC.   Vaginal Bleeding: small about of reddish-brown blood and mucus Passage of tissue or clots: Denies Dizziness: Denies  A POS  Pain Location: LLQ Quality: cramping and sharp Severity: 7/10 on pain scale Duration: 3 days Course: worsening Context: early pregnancy Timing: intermittent Modifying factors: none hasn't tried anything Associated signs and symptoms: Pos for vaginal bleeding, vaginal discharge, frequency of urination. Neg for fever, chills, dysuria, hematuria, N/V/C/C.   Past Medical History:  Diagnosis Date  . Infection    UTI   OB History  Gravida Para Term Preterm AB Living  2       1    SAB TAB Ectopic Multiple Live Births  1            # Outcome Date GA Lbr Len/2nd Weight Sex Delivery Anes PTL Lv  2 Current           1 SAB            Past Surgical History:  Procedure Laterality Date  . NO PAST SURGERIES     Social History   Socioeconomic History  . Marital status: Married    Spouse name: Not on file  . Number of children: Not on file  . Years of education: Not on file  . Highest education level: Not on file  Occupational History  . Not on file  Social Needs  . Financial resource strain: Not on file  . Food insecurity    Worry: Not on file    Inability: Not on file  . Transportation needs    Medical: Not on file    Non-medical: Not on file  Tobacco Use  . Smoking status: Never Smoker  . Smokeless tobacco: Never Used  Substance and Sexual Activity  . Alcohol use: No  . Drug use: No  .  Sexual activity: Yes  Lifestyle  . Physical activity    Days per week: Not on file    Minutes per session: Not on file  . Stress: Not on file  Relationships  . Social Musician on phone: Not on file    Gets together: Not on file    Attends religious service: Not on file    Active member of club or organization: Not on file    Attends meetings of clubs or organizations: Not on file    Relationship status: Not on file  . Intimate partner violence    Fear of current or ex partner: Not on file    Emotionally abused: Not on file    Physically abused: Not on file    Forced sexual activity: Not on file  Other Topics Concern  . Not on file  Social History Narrative  . Not on file   No current facility-administered medications on file prior to encounter.    Current Outpatient Medications on File Prior to Encounter  Medication Sig Dispense Refill  . budesonide (RHINOCORT AQUA) 32 MCG/ACT nasal spray Place 2 sprays into both nostrils daily. (Patient not taking: Reported  on 01/09/2017) 8.43 mL 0  . cetirizine (ZYRTEC) 10 MG tablet Take 1 tablet (10 mg total) by mouth daily. (Patient not taking: Reported on 01/09/2017) 30 tablet 0  . diphenhydrAMINE (BENADRYL) 25 MG tablet Take 1 tablet (25 mg total) by mouth every 6 (six) hours as needed for up to 1 day. 4 tablet 0  . EPINEPHrine 0.3 mg/0.3 mL IJ SOAJ injection Inject 0.3 mLs (0.3 mg total) into the muscle as needed for anaphylaxis. 2 each 2  . famotidine (PEPCID) 20 MG tablet Take 1 tablet (20 mg total) by mouth 2 (two) times daily for 3 days. 6 tablet 0  . Pyridoxine HCl (B-6 PO) Take 1 tablet by mouth at bedtime.     Allergies  Allergen Reactions  . Banana Anaphylaxis, Hives, Itching and Swelling    Throat swells    I have reviewed the past Medical Hx, Surgical Hx, Social Hx, Allergies and Medications.   Review of Systems  Constitutional: Negative for chills and fever.  Gastrointestinal: Positive for abdominal pain.  Negative for abdominal distention, constipation, diarrhea, nausea and vomiting.  Genitourinary: Positive for frequency, pelvic pain, vaginal bleeding and vaginal discharge. Negative for difficulty urinating, dyspareunia, dysuria, flank pain, hematuria and vaginal pain.  Musculoskeletal: Negative for back pain.  Neurological: Negative for dizziness.    OBJECTIVE Patient Vitals for the past 24 hrs:  BP Temp Temp src Pulse Resp SpO2 Height Weight  01/28/19 1312 - 98.3 F (36.8 C) Oral - - - - -  01/28/19 1253 (!) 110/53 98.3 F (36.8 C) Oral 80 16 100 % 4\' 11"  (1.499 m) 55.6 kg   Constitutional: Well-developed, well-nourished female in no acute distress.  Cardiovascular: normal rate Respiratory: normal rate and effort.  GI: Abd soft, + LLQ tenderness. Pos BS x 4 MS: Extremities nontender, no edema, normal ROM Neurologic: Alert and oriented x 4.  GU: Neg CVAT.  SPECULUM EXAM: NEFG, physiologic discharge, small amount of brown blood and mucus noted, cervix clean  BIMANUAL: cervix closed; uterus 14-week size, no adnexal tenderness. ? Right adnexal mass. No CMT.  LAB RESULTS Results for orders placed or performed during the hospital encounter of 01/28/19 (from the past 24 hour(s))  Wet prep, genital     Status: Abnormal   Collection Time: 01/28/19  1:32 PM  Result Value Ref Range   Yeast Wet Prep HPF POC NONE SEEN NONE SEEN   Trich, Wet Prep NONE SEEN NONE SEEN   Clue Cells Wet Prep HPF POC NONE SEEN NONE SEEN   WBC, Wet Prep HPF POC MANY (A) NONE SEEN   Sperm NONE SEEN   CBC with Differential/Platelet     Status: Abnormal   Collection Time: 01/28/19  1:48 PM  Result Value Ref Range   WBC 9.1 4.0 - 10.5 K/uL   RBC 4.29 3.87 - 5.11 MIL/uL   Hemoglobin 11.7 (L) 12.0 - 15.0 g/dL   HCT 01/30/19 (L) 92.1 - 19.4 %   MCV 80.2 80.0 - 100.0 fL   MCH 27.3 26.0 - 34.0 pg   MCHC 34.0 30.0 - 36.0 g/dL   RDW 17.4 08.1 - 44.8 %   Platelets 241 150 - 400 K/uL   nRBC 0.0 0.0 - 0.2 %   Neutrophils  Relative % 70 %   Neutro Abs 6.3 1.7 - 7.7 K/uL   Lymphocytes Relative 19 %   Lymphs Abs 1.7 0.7 - 4.0 K/uL   Monocytes Relative 9 %   Monocytes Absolute 0.8 0.1 - 1.0  K/uL   Eosinophils Relative 2 %   Eosinophils Absolute 0.2 0.0 - 0.5 K/uL   Basophils Relative 0 %   Basophils Absolute 0.0 0.0 - 0.1 K/uL   Immature Granulocytes 0 %   Abs Immature Granulocytes 0.03 0.00 - 0.07 K/uL  Urinalysis, Routine w reflex microscopic     Status: Abnormal   Collection Time: 01/28/19  2:42 PM  Result Value Ref Range   Color, Urine YELLOW YELLOW   APPearance HAZY (A) CLEAR   Specific Gravity, Urine 1.025 1.005 - 1.030   pH 6.0 5.0 - 8.0   Glucose, UA NEGATIVE NEGATIVE mg/dL   Hgb urine dipstick MODERATE (A) NEGATIVE   Bilirubin Urine NEGATIVE NEGATIVE   Ketones, ur 80 (A) NEGATIVE mg/dL   Protein, ur 30 (A) NEGATIVE mg/dL   Nitrite NEGATIVE NEGATIVE   Leukocytes,Ua NEGATIVE NEGATIVE   RBC / HPF 0-5 0 - 5 RBC/hpf   WBC, UA 0-5 0 - 5 WBC/hpf   Bacteria, UA MANY (A) NONE SEEN   Squamous Epithelial / LPF 6-10 0 - 5   Mucus PRESENT    Ca Oxalate Crys, UA PRESENT     IMAGING [place] Ob Comp Less 14 Wks  Result Date: 01/28/2019 CLINICAL DATA:  Vaginal bleeding. EXAM: OBSTETRIC <14 WK ULTRASOUND TECHNIQUE: Transabdominal ultrasound was performed for evaluation of the gestation as well as the maternal uterus and adnexal regions. COMPARISON:  12/09/2015 FINDINGS: Intrauterine gestational sac: Single Yolk sac:  No Embryo:  Yes Cardiac Activity: Yes Heart Rate: 146 bpm CRL:   75.4 mm   13 w 4 d                  US EDC: 08/01/2019 Subchorionic hemorrhage:  None visualized. Subchorionic hemorrhage: None Right ovary: Normal Left ovary: Normal Other :None Free fluid:  None IMPRESSION: 1. Single living intrauterine gestation with an estimated gestational age of [redacted] weeks and 4 days. No complications. Electronically Signed   By: Signa Kellaylor  Stroud M.D.   On: 01/28/2019 14:38    MAU COURSE CBC, ultrasound, wet prep,  UA  MDM Pain and bleeding in early pregnancy with normal intrauterine pregnancy and hemodynamically stable. Uncertain etiology, but no evidence of miscarriage in progress, ectopic pregnancy or other emergent condition.   ASSESSMENT 1. UTI (urinary tract infection) during pregnancy, first trimester   2. Vaginal bleeding in pregnancy, first trimester   3. Left lower quadrant abdominal pain affecting pregnancy in first trimester     PLAN Discharge home in stable condition. Bleeding precautions Follow-up Information    Gynecology, [facility] up in 4 week(s).   Specialty: Obstetrics and Gynecology Why: as scheduled or sooner as needed if symptoms worsen.  Contact information: [address] [place] [id number] [phone]        [facility] Follow up.   Specialty: Obstetrics and Gynecology Why: as needed in pregnancy emergencies Contact information: [address] [id number] [place] [id number] [phone]         Allergies as of 01/28/2019      Reactions   Banana Anaphylaxis, Hives, Itching, Swelling   Throat swells      Medication List    STOP taking these medications   budesonide 32 MCG/ACT nasal spray Commonly known as: RHINOCORT AQUA   cetirizine 10 MG tablet Commonly known as: ZYRTEC     TAKE these medications   B-6 PO Take 1 tablet by mouth at bedtime.   diphenhydrAMINE  25 MG tablet Commonly known as: BENADRYL Take 1 tablet (25 mg total) by mouth every 6 (six) hours as needed for up to 1 day.   EPINEPHrine 0.3 mg/0.3 mL Soaj injection Commonly known as: EPI-PEN Inject 0.3 mLs (0.3 mg total) into the muscle as needed for anaphylaxis.   famotidine 20 MG tablet Commonly known as: PEPCID Take 1 tablet (20 mg total) by mouth 2 (two) times daily for 3 days.   nitrofurantoin (macrocrystal-monohydrate) 100 MG capsule Commonly known as: Macrobid Take 1 capsule (100 mg total) by  mouth 2 (two) times daily.        Tamala Julian, [place], [place] 01/28/2019  3:23 PM  4

## 2019-01-28 NOTE — MAU Note (Signed)
Pt c/o spotting and bark brown discharge with cramping.  Cramping started about 2 days ago and spotting started yesterday.  Patient has pain in abdomen underbelly button and on left side when palpated.   FHR noted in 140-150 BMP range.  Pt has h/o SAB with previous pregnancy.

## 2019-01-28 NOTE — MAU Note (Signed)
Saw dark brown when she used the restroom.  Was cramping yesterday. Last night when she was getting in the shower, she noted brown d/c on her underwear and some stringing brown stuff was falling out. Saw red when she wiped yesterday.

## 2019-05-02 NOTE — L&D Delivery Note (Signed)
Delivery Note At 12:51 PM a viable female was delivered via Vaginal, Spontaneous (Presentation:  Occiput anterior ).  APGAR: 9, 9; weight 5 lb 10.1 oz (2555 g).   Placenta status: Spontaneous, Intact.  Cord: 3 vessels with the following complications: None.  Cord pH NA  Anesthesia: Spinal Episiotomy: None Lacerations: 1st degree Suture Repair: 3.0 vicryl Est. Blood Loss (mL): 100  Mom to postpartum.  Baby to Couplet care / Skin to Skin.  Gerald Leitz 07/29/2019, 8:04 PM

## 2019-05-12 ENCOUNTER — Other Ambulatory Visit: Payer: Self-pay

## 2019-05-12 ENCOUNTER — Inpatient Hospital Stay (HOSPITAL_COMMUNITY)
Admission: AD | Admit: 2019-05-12 | Discharge: 2019-05-12 | Disposition: A | Discharge status: Home / Self Care | Payer: Medicaid Other | Attending: Obstetrics and Gynecology | Admitting: Obstetrics and Gynecology

## 2019-05-12 ENCOUNTER — Encounter (HOSPITAL_COMMUNITY): Payer: Self-pay | Admitting: Obstetrics and Gynecology

## 2019-05-12 DIAGNOSIS — Z3A28 28 weeks gestation of pregnancy: Secondary | ICD-10-CM | POA: Diagnosis not present

## 2019-05-12 DIAGNOSIS — O479 False labor, unspecified: Secondary | ICD-10-CM

## 2019-05-12 DIAGNOSIS — Z79899 Other long term (current) drug therapy: Secondary | ICD-10-CM | POA: Diagnosis not present

## 2019-05-12 DIAGNOSIS — O4703 False labor before 37 completed weeks of gestation, third trimester: Secondary | ICD-10-CM

## 2019-05-12 DIAGNOSIS — Z91018 Allergy to other foods: Secondary | ICD-10-CM | POA: Diagnosis not present

## 2019-05-12 DIAGNOSIS — Z800 Family history of malignant neoplasm of digestive organs: Secondary | ICD-10-CM | POA: Diagnosis not present

## 2019-05-12 LAB — URINALYSIS, ROUTINE W REFLEX MICROSCOPIC
Bilirubin Urine: NEGATIVE
Glucose, UA: 150 mg/dL — AB
Hgb urine dipstick: NEGATIVE
Ketones, ur: NEGATIVE mg/dL
Nitrite: NEGATIVE
Protein, ur: NEGATIVE mg/dL
Specific Gravity, Urine: 1.019 (ref 1.005–1.030)
pH: 7 (ref 5.0–8.0)

## 2019-05-12 LAB — WET PREP, GENITAL
Clue Cells Wet Prep HPF POC: NONE SEEN
Sperm: NONE SEEN
Trich, Wet Prep: NONE SEEN
Yeast Wet Prep HPF POC: NONE SEEN

## 2019-05-12 LAB — FETAL FIBRONECTIN: Fetal Fibronectin: NEGATIVE

## 2019-05-12 NOTE — MAU Note (Signed)
Was at work.  Was getting what felt like period cramps, then realized they were coming like frequently.  Have gotten closer and stronger, now every 5 min. No bleeding or leaking

## 2019-05-12 NOTE — MAU Provider Note (Signed)
History     CSN: [id number]  Arrival date and time: 05/12/19 1358   First Provider Initiated Contact with Patient 05/12/19 1448      Chief Complaint  Patient presents with  . Contractions   HPI   Linda Curry is a 24 y.o. female G2P0010 @ [redacted]w[redacted]d here with contraction pain. States this morning around 10:00 while at work she started feeling period like cramping in her lower abdomen. She then started timing the pain and she noticed that the pains were coming every 7 minutes, then she felt them every 6 minutes. She did not take any medication for the cramping.  This is when she called her Dr. No bleeding. No contractions at this moment. + fetal movement.   No recent intercourse   OB History    Gravida  2   Para      Term      Preterm      AB  1   Living        SAB  1   TAB      Ectopic      Multiple      Live Births              Past Medical History:  Diagnosis Date  . Infection    UTI    Past Surgical History:  Procedure Laterality Date  . NO PAST SURGERIES      Family History  Problem Relation Age of Onset  . Asthma Mother   . Cancer Maternal Grandmother        pancreatic    Social History   Tobacco Use  . Smoking status: Never Smoker  . Smokeless tobacco: Never Used  Substance Use Topics  . Alcohol use: No  . Drug use: No    Allergies:  Allergies  Allergen Reactions  . Banana Anaphylaxis, Hives, Itching and Swelling    Throat swells    Medications Prior to Admission  Medication Sig Dispense Refill Last Dose  . diphenhydrAMINE (BENADRYL) 25 MG tablet Take 1 tablet (25 mg total) by mouth every 6 (six) hours as needed for up to 1 day. 4 tablet 0   . EPINEPHrine 0.3 mg/0.3 mL IJ SOAJ injection Inject 0.3 mLs (0.3 mg total) into the muscle as needed for anaphylaxis. 2 each 2   . famotidine (PEPCID) 20 MG tablet Take 1 tablet (20 mg total) by mouth 2 (two) times daily for 3 days. 6 tablet 0   . nitrofurantoin,  macrocrystal-monohydrate, (MACROBID) 100 MG capsule Take 1 capsule (100 mg total) by mouth 2 (two) times daily. 14 capsule 0   . Pyridoxine HCl (B-6 PO) Take 1 tablet by mouth at bedtime.      Results for orders placed or performed during the hospital encounter of 05/12/19 (from the past 72 hour(s))  Urinalysis, Routine w reflex microscopic     Status: Abnormal   Collection Time: 05/12/19  2:15 PM  Result Value Ref Range   Color, Urine YELLOW YELLOW   APPearance HAZY (A) CLEAR   Specific Gravity, Urine 1.019 1.005 - 1.030   pH 7.0 5.0 - 8.0   Glucose, UA 150 (A) NEGATIVE mg/dL   Hgb urine dipstick NEGATIVE NEGATIVE   Bilirubin Urine NEGATIVE NEGATIVE   Ketones, ur NEGATIVE NEGATIVE mg/dL   Protein, ur NEGATIVE NEGATIVE mg/dL   Nitrite NEGATIVE NEGATIVE   Leukocytes,Ua TRACE (A) NEGATIVE   RBC / HPF 0-5 0 - 5 RBC/hpf   WBC, UA 0-5 0 -  5 WBC/hpf   Bacteria, UA FEW (A) NONE SEEN   Squamous Epithelial / LPF 0-5 0 - 5   Mucus PRESENT     Comment: Performed at [facility], [address] [place], [place] [address]  OB Urine Culture     Status: Abnormal   Collection Time: 05/12/19  2:20 PM   Specimen: OB Clean Catch; Urine  Result Value Ref Range   Specimen Description OB CLEAN CATCH    Special Requests ADDED [id number] 05/13/2019    Culture (A)     MULTIPLE SPECIES PRESENT, SUGGEST RECOLLECTION NO GROUP B STREP (S.AGALACTIAE) ISOLATED Performed at [facility], [address], [place], [place] [address]    Report Status 05/14/2019 FINAL   Fetal fibronectin     Status: None   Collection Time: 05/12/19  3:29 PM  Result Value Ref Range   Fetal Fibronectin NEGATIVE NEGATIVE    Comment: Performed at [facility], [address], [place], [place] [address]  Wet prep, genital     Status: Abnormal   Collection Time: 05/12/19  3:29 PM  Result Value Ref Range   Yeast Wet Prep HPF POC NONE SEEN NONE SEEN   Trich, Wet Prep NONE SEEN NONE SEEN   Clue Cells Wet Prep HPF POC  NONE SEEN NONE SEEN   WBC, Wet Prep HPF POC FEW (A) NONE SEEN   Sperm NONE SEEN     Comment: Performed at [facility], [address] [place], [place] [address]    Review of Systems  Constitutional: Negative for fever.  Gastrointestinal: Negative for nausea and vomiting.   Physical Exam   Blood pressure 114/60, pulse 89, temperature 98.6 F (37 C), temperature source Oral, resp. rate 18, height 4' 11.5" (1.511 m), weight 59.4 kg, last menstrual period 10/26/2018, SpO2 100 %, unknown if currently breastfeeding.  Physical Exam  Constitutional: She is oriented to person, place, and time. She appears well-developed and well-nourished. No distress.  HENT:  Head: Normocephalic.  Eyes: Pupils are equal, round, and reactive to light.  GI: Soft. She exhibits no distension. There is no abdominal tenderness. There is no rebound and no guarding.  Genitourinary:    Genitourinary Comments: Cervix: closed, thick, posterior    Musculoskeletal:        General: Normal range of motion.  Neurological: She is alert and oriented to person, place, and time.  Skin: Skin is warm. She is not diaphoretic.  Psychiatric: Her behavior is normal.   Fetal Tracing: Baseline: 140 bpm Variability: Moderate  Accelerations: 15x15 Decelerations: None Toco: 1 contraction noted.   MAU Course  Procedures  None  MDM  FFN negative Reactive NST. No contractions noted on monitor. Cervix is long and closed. Patient feels reassured.   Assessment and Plan   A:  1. Braxton Hicks contractions   2. [redacted] weeks gestation of pregnancy     P:  Discharge home in stable condition Return to MAU if symptoms worsen F/ u with OB Increase oral fluid intake   Hydeia Mcatee, Harolyn Rutherford, NP 05/14/2019 2:03 PM

## 2019-05-12 NOTE — Discharge Instructions (Signed)
Braxton Hicks Contractions °Contractions of the uterus can occur throughout pregnancy, but they are not always a sign that you are in labor. You may have practice contractions called Braxton Hicks contractions. These false labor contractions are sometimes confused with true labor. °What are Braxton Hicks contractions? °Braxton Hicks contractions are tightening movements that occur in the muscles of the uterus before labor. Unlike true labor contractions, these contractions do not result in opening (dilation) and thinning of the cervix. Toward the end of pregnancy (32-34 weeks), Braxton Hicks contractions can happen more often and may become stronger. These contractions are sometimes difficult to tell apart from true labor because they can be very uncomfortable. You should not feel embarrassed if you go to the hospital with false labor. °Sometimes, the only way to tell if you are in true labor is for your health care provider to look for changes in the cervix. The health care provider will do a physical exam and may monitor your contractions. If you are not in true labor, the exam should show that your cervix is not dilating and your water has not broken. °If there are no other health problems associated with your pregnancy, it is completely safe for you to be sent home with false labor. You may continue to have Braxton Hicks contractions until you go into true labor. °How to tell the difference between true labor and false labor °True labor °· Contractions last 30-70 seconds. °· Contractions become very regular. °· Discomfort is usually felt in the top of the uterus, and it spreads to the lower abdomen and low back. °· Contractions do not go away with walking. °· Contractions usually become more intense and increase in frequency. °· The cervix dilates and gets thinner. °False labor °· Contractions are usually shorter and not as strong as true labor contractions. °· Contractions are usually irregular. °· Contractions  are often felt in the front of the lower abdomen and in the groin. °· Contractions may go away when you walk around or change positions while lying down. °· Contractions get weaker and are shorter-lasting as time goes on. °· The cervix usually does not dilate or become thin. °Follow these instructions at home: ° °· Take over-the-counter and prescription medicines only as told by your health care provider. °· Keep up with your usual exercises and follow other instructions from your health care provider. °· Eat and drink lightly if you think you are going into labor. °· If Braxton Hicks contractions are making you uncomfortable: °? Change your position from lying down or resting to walking, or change from walking to resting. °? Sit and rest in a tub of warm water. °? Drink enough fluid to keep your urine pale yellow. Dehydration may cause these contractions. °? Do slow and deep breathing several times an hour. °· Keep all follow-up prenatal visits as told by your health care provider. This is important. °Contact a health care provider if: °· You have a fever. °· You have continuous pain in your abdomen. °Get help right away if: °· Your contractions become stronger, more regular, and closer together. °· You have fluid leaking or gushing from your vagina. °· You pass blood-tinged mucus (bloody show). °· You have bleeding from your vagina. °· You have low back pain that you never had before. °· You feel your baby’s head pushing down and causing pelvic pressure. °· Your baby is not moving inside you as much as it used to. °Summary °· Contractions that occur before labor are   called Braxton Hicks contractions, false labor, or practice contractions. °· Braxton Hicks contractions are usually shorter, weaker, farther apart, and less regular than true labor contractions. True labor contractions usually become progressively stronger and regular, and they become more frequent. °· Manage discomfort from Braxton Hicks contractions  by changing position, resting in a warm bath, drinking plenty of water, or practicing deep breathing. °This information is not intended to replace advice given to you by your health care provider. Make sure you discuss any questions you have with your health care provider. °Document Revised: 03/30/2017 Document Reviewed: 08/31/2016 °Elsevier Patient Education © 2020 [employer] Inc. ° °

## 2019-05-14 LAB — CULTURE, OB URINE

## 2019-05-19 ENCOUNTER — Inpatient Hospital Stay (HOSPITAL_COMMUNITY)
Admission: AD | Admit: 2019-05-19 | Discharge: 2019-05-19 | Disposition: A | Discharge status: Home / Self Care | Payer: Medicaid Other | Attending: Obstetrics and Gynecology | Admitting: Obstetrics and Gynecology

## 2019-05-19 ENCOUNTER — Other Ambulatory Visit: Payer: Self-pay

## 2019-05-19 ENCOUNTER — Encounter (HOSPITAL_COMMUNITY): Payer: Self-pay | Admitting: Obstetrics and Gynecology

## 2019-05-19 DIAGNOSIS — Z87440 Personal history of urinary (tract) infections: Secondary | ICD-10-CM | POA: Diagnosis not present

## 2019-05-19 DIAGNOSIS — N898 Other specified noninflammatory disorders of vagina: Secondary | ICD-10-CM | POA: Diagnosis present

## 2019-05-19 DIAGNOSIS — Z91018 Allergy to other foods: Secondary | ICD-10-CM | POA: Insufficient documentation

## 2019-05-19 DIAGNOSIS — Z3A29 29 weeks gestation of pregnancy: Secondary | ICD-10-CM | POA: Diagnosis not present

## 2019-05-19 DIAGNOSIS — Z0371 Encounter for suspected problem with amniotic cavity and membrane ruled out: Secondary | ICD-10-CM | POA: Diagnosis not present

## 2019-05-19 LAB — URINALYSIS, ROUTINE W REFLEX MICROSCOPIC
Bilirubin Urine: NEGATIVE
Glucose, UA: NEGATIVE mg/dL
Ketones, ur: NEGATIVE mg/dL
Leukocytes,Ua: NEGATIVE
Nitrite: NEGATIVE
Protein, ur: NEGATIVE mg/dL
Specific Gravity, Urine: 1.012 (ref 1.005–1.030)
pH: 7 (ref 5.0–8.0)

## 2019-05-19 LAB — POCT FERN TEST: POCT Fern Test: NEGATIVE

## 2019-05-19 LAB — WET PREP, GENITAL
Clue Cells Wet Prep HPF POC: NONE SEEN
Sperm: NONE SEEN
Trich, Wet Prep: NONE SEEN
Yeast Wet Prep HPF POC: NONE SEEN

## 2019-05-19 LAB — AMNISURE RUPTURE OF MEMBRANE (ROM) NOT AT ARMC: Amnisure ROM: NEGATIVE

## 2019-05-19 NOTE — MAU Provider Note (Signed)
Chief Complaint:  Abdominal Pain   First Provider Initiated Contact with Patient 05/19/19 1551     HPI: Linda Curry is a 24 y.o. G2P0010 at [redacted]w[redacted]d who presents to maternity admissions reporting vaginal discharge & abdominal cramping. States yesterday she passed a large amount of mucous that looked like her mucous plug. Since then has been leaking clear fluid that has left wet spots in her pants. Has not noticed odor. Denies vaginal bleeding. Has had some abdominal cramping. No recent intercourse. Good fetal movement.   Location: abdomen Quality: cramping Severity: 3/10 in pain scale Duration: 2 days Timing: intermittent Modifying factors: none Associated signs and symptoms: vaginal discharge  Past Medical History:  Diagnosis Date  . Infection    UTI   OB History  Gravida Para Term Preterm AB Living  2       1    SAB TAB Ectopic Multiple Live Births  1            # Outcome Date GA Lbr Len/2nd Weight Sex Delivery Anes PTL Lv  2 Current           1 SAB            Past Surgical History:  Procedure Laterality Date  . NO PAST SURGERIES     Family History  Problem Relation Age of Onset  . Asthma Mother   . Cancer Maternal Grandmother        pancreatic   Social History   Tobacco Use  . Smoking status: Never Smoker  . Smokeless tobacco: Never Used  Substance Use Topics  . Alcohol use: No  . Drug use: No   Allergies  Allergen Reactions  . Banana Anaphylaxis, Hives, Itching and Swelling    Throat swells   No medications prior to admission.    I have reviewed patient's Past Medical Hx, Surgical Hx, Family Hx, Social Hx, medications and allergies.   ROS:  Review of Systems  Constitutional: Negative.   Gastrointestinal: Positive for abdominal pain. Negative for constipation, diarrhea, nausea and vomiting.  Genitourinary: Positive for vaginal discharge. Negative for dysuria and vaginal bleeding.    Physical Exam   Patient Vitals for the past 24 hrs:  BP Temp  Pulse Resp  05/19/19 1737 106/66 -- 89 --  05/19/19 1531 (!) 114/50 98.2 F (36.8 C) 92 18    Constitutional: Well-developed, well-nourished female in no acute distress.  Cardiovascular: normal rate & rhythm, no murmur Respiratory: normal effort, lung sounds clear throughout GI: Abd soft, non-tender, gravid appropriate for gestational age. Pos BS x 4 MS: Extremities nontender, no edema, normal ROM Neurologic: Alert and oriented x 4.  GU:   SSE: NEFG. No pooling of fluid. Normal discharge. No blood. Cervix closed  NST:  Baseline: 140 bpm, Variability: Good {> 6 bpm), Accelerations: Reactive and Decelerations: prolonged decel x 1   Labs: Results for orders placed or performed during the hospital encounter of 05/19/19 (from the past 24 hour(s))  Urinalysis, Routine w reflex microscopic     Status: Abnormal   Collection Time: 05/19/19  3:37 PM  Result Value Ref Range   Color, Urine YELLOW YELLOW   APPearance CLEAR CLEAR   Specific Gravity, Urine 1.012 1.005 - 1.030   pH 7.0 5.0 - 8.0   Glucose, UA NEGATIVE NEGATIVE mg/dL   Hgb urine dipstick SMALL (A) NEGATIVE   Bilirubin Urine NEGATIVE NEGATIVE   Ketones, ur NEGATIVE NEGATIVE mg/dL   Protein, ur NEGATIVE NEGATIVE mg/dL   Nitrite  NEGATIVE NEGATIVE   Leukocytes,Ua NEGATIVE NEGATIVE   RBC / HPF 21-50 0 - 5 RBC/hpf   WBC, UA 0-5 0 - 5 WBC/hpf   Bacteria, UA RARE (A) NONE SEEN   Squamous Epithelial / LPF 0-5 0 - 5  Amnisure rupture of membrane (rom)not at [facility]     Status: None   Collection Time: 05/19/19  4:41 PM  Result Value Ref Range   Amnisure ROM NEGATIVE   Wet prep, genital     Status: Abnormal   Collection Time: 05/19/19  4:42 PM  Result Value Ref Range   Yeast Wet Prep HPF POC NONE SEEN NONE SEEN   Trich, Wet Prep NONE SEEN NONE SEEN   Clue Cells Wet Prep HPF POC NONE SEEN NONE SEEN   WBC, Wet Prep HPF POC MODERATE (A) NONE SEEN   Sperm NONE SEEN   POCT fern test     Status: None   Collection Time: 05/19/19  4:56 PM   Result Value Ref Range   POCT Fern Test Negative = intact amniotic membranes     Imaging:  No results found.  MAU Course: Orders Placed This Encounter  Procedures  . Wet prep, genital  . Urinalysis, Routine w reflex microscopic  . Amnisure rupture of membrane (rom)not at [facility]  . POCT fern test  . Discharge patient   No orders of the defined types were placed in this encounter.   MDM: Sterile spec exam performed - no pooling of fluid, discharge normal. Some friability of cervix. Wet prep negative. Fern negative. Amnisure negative. Cervix closed.   Prolonged decel x 1. >1 hour of monitoring after deceleration, fetal tracing appropriate for gestation. Reviewed with Drs. Maeola Sarah, ok to discharge home.  Assessment: 1. Encounter for suspected PROM, with rupture of membranes not found   2. [redacted] weeks gestation of pregnancy     Plan: Discharge home in stable condition.  Preterm Labor precautions and fetal kick counts F/u with ob/gyn later this week as scheduled Follow-up Information    Gynecology, [facility] Follow up.   Specialty: Obstetrics and Gynecology Why: keep scheduled appointment or call office as needed Contact information: [address] [place] [address] [phone]        [facility] Follow up.   Specialty: Obstetrics and Gynecology Why: return for worsening symptoms Contact information: [address] [id number] [address]hemina Bonito [place] [address] [phone]          Allergies as of 05/19/2019      Reactions   Banana Anaphylaxis, Hives, Itching, Swelling   Throat swells      Medication List    TAKE these medications   B-6 PO Take 1 tablet by mouth at bedtime.   diphenhydrAMINE 25 MG tablet Commonly known as: BENADRYL Take 1 tablet (25 mg total) by mouth every 6 (six) hours as needed for up to 1 day.   EPINEPHrine 0.3 mg/0.3 mL Soaj injection Commonly known as: EPI-PEN Inject  0.3 mLs (0.3 mg total) into the muscle as needed for anaphylaxis.   famotidine 20 MG tablet Commonly known as: PEPCID Take 1 tablet (20 mg total) by mouth 2 (two) times daily for 3 days.   prenatal multivitamin Tabs tablet Take 1 tablet by mouth daily at 12 noon.       Judeth Horn, NP 05/19/2019 7:19 PM

## 2019-05-19 NOTE — Discharge Instructions (Signed)
Fetal Movement Counts Patient Name: ________________________________________________ Patient Due Date: ____________________ What is a fetal movement count?  A fetal movement count is the number of times that you feel your baby move during a certain amount of time. This may also be called a fetal kick count. A fetal movement count is recommended for every pregnant woman. You may be asked to start counting fetal movements as early as week 28 of your pregnancy. Pay attention to when your baby is most active. You may notice your baby's sleep and wake cycles. You may also notice things that make your baby move more. You should do a fetal movement count:  When your baby is normally most active.  At the same time each day. A good time to count movements is while you are resting, after having something to eat and drink. How do I count fetal movements? 1. Find a quiet, comfortable area. Sit, or lie down on your side. 2. Write down the date, the start time and stop time, and the number of movements that you felt between those two times. Take this information with you to your health care visits. 3. Write down your start time when you feel the first movement. 4. Count kicks, flutters, swishes, rolls, and jabs. You should feel at least 10 movements. 5. You may stop counting after you have felt 10 movements, or if you have been counting for 2 hours. Write down the stop time. 6. If you do not feel 10 movements in 2 hours, contact your health care provider for further instructions. Your health care provider may want to do additional tests to assess your baby's well-being. Contact a health care provider if:  You feel fewer than 10 movements in 2 hours.  Your baby is not moving like he or she usually does. Date: ____________ Start time: ____________ Stop time: ____________ Movements: ____________ Date: ____________ Start time: ____________ Stop time: ____________ Movements: ____________ Date: ____________  Start time: ____________ Stop time: ____________ Movements: ____________ Date: ____________ Start time: ____________ Stop time: ____________ Movements: ____________ Date: ____________ Start time: ____________ Stop time: ____________ Movements: ____________ Date: ____________ Start time: ____________ Stop time: ____________ Movements: ____________ Date: ____________ Start time: ____________ Stop time: ____________ Movements: ____________ Date: ____________ Start time: ____________ Stop time: ____________ Movements: ____________ Date: ____________ Start time: ____________ Stop time: ____________ Movements: ____________ This information is not intended to replace advice given to you by your health care provider. Make sure you discuss any questions you have with your health care provider. Document Revised: 12/05/2018 Document Reviewed: 12/05/2018 Elsevier Patient Education  2020 Elsevier Inc. Preterm Labor and Birth Information  The normal length of a pregnancy is 39-41 weeks. Preterm labor is when labor starts before 37 completed weeks of pregnancy. What are the risk factors for preterm labor? Preterm labor is more likely to occur in women who:  Have certain infections during pregnancy such as a bladder infection, sexually transmitted infection, or infection inside the uterus (chorioamnionitis).  Have a shorter-than-normal cervix.  Have gone into preterm labor before.  Have had surgery on their cervix.  Are younger than age 17 or older than age 35.  Are African American.  Are pregnant with twins or multiple babies (multiple gestation).  Take street drugs or smoke while pregnant.  Do not gain enough weight while pregnant.  Became pregnant shortly after having been pregnant. What are the symptoms of preterm labor? Symptoms of preterm labor include:  Cramps similar to those that can happen during a menstrual period. The   cramps may happen with diarrhea.  Pain in the abdomen or lower  back.  Regular uterine contractions that may feel like tightening of the abdomen.  A feeling of increased pressure in the pelvis.  Increased watery or bloody mucus discharge from the vagina.  Water breaking (ruptured amniotic sac). Why is it important to recognize signs of preterm labor? It is important to recognize signs of preterm labor because babies who are born prematurely may not be fully developed. This can put them at an increased risk for:  Long-term (chronic) heart and lung problems.  Difficulty immediately after birth with regulating body systems, including blood sugar, body temperature, heart rate, and breathing rate.  Bleeding in the brain.  Cerebral palsy.  Learning difficulties.  Death. These risks are highest for babies who are born before 34 weeks of pregnancy. How is preterm labor treated? Treatment depends on the length of your pregnancy, your condition, and the health of your baby. It may involve:  Having a stitch (suture) placed in your cervix to prevent your cervix from opening too early (cerclage).  Taking or being given medicines, such as: ? Hormone medicines. These may be given early in pregnancy to help support the pregnancy. ? Medicine to stop contractions. ? Medicines to help mature the baby's lungs. These may be prescribed if the risk of delivery is high. ? Medicines to prevent your baby from developing cerebral palsy. If the labor happens before 34 weeks of pregnancy, you may need to stay in the hospital. What should I do if I think I am in preterm labor? If you think that you are going into preterm labor, call your health care provider right away. How can I prevent preterm labor in future pregnancies? To increase your chance of having a full-term pregnancy:  Do not use any tobacco products, such as cigarettes, chewing tobacco, and e-cigarettes. If you need help quitting, ask your health care provider.  Do not use street drugs or medicines that  have not been prescribed to you during your pregnancy.  Talk with your health care provider before taking any herbal supplements, even if you have been taking them regularly.  Make sure you gain a healthy amount of weight during your pregnancy.  Watch for infection. If you think that you might have an infection, get it checked right away.  Make sure to tell your health care provider if you have gone into preterm labor before. This information is not intended to replace advice given to you by your health care provider. Make sure you discuss any questions you have with your health care provider. Document Revised: 08/09/2018 Document Reviewed: 09/08/2015 Elsevier Patient Education  2020 Elsevier Inc.  

## 2019-05-19 NOTE — MAU Note (Signed)
Pt reports that yesterday she had a large amount of mucus(possible mucus plug ) fall out when she went to the [facility]. Has been having lower abd pain/cramping on and off since yesterday. Pain is irregular and mild. Also reports she feels a small amount of leaking since yesterday.

## 2019-06-25 ENCOUNTER — Inpatient Hospital Stay (HOSPITAL_COMMUNITY)
Admission: AD | Admit: 2019-06-25 | Discharge: 2019-06-25 | Disposition: A | Discharge status: Home / Self Care | Payer: Medicaid Other | Attending: Obstetrics and Gynecology | Admitting: Obstetrics and Gynecology

## 2019-06-25 ENCOUNTER — Other Ambulatory Visit: Payer: Self-pay

## 2019-06-25 ENCOUNTER — Encounter (HOSPITAL_COMMUNITY): Payer: Self-pay | Admitting: Obstetrics and Gynecology

## 2019-06-25 DIAGNOSIS — Z825 Family history of asthma and other chronic lower respiratory diseases: Secondary | ICD-10-CM | POA: Insufficient documentation

## 2019-06-25 DIAGNOSIS — Z8249 Family history of ischemic heart disease and other diseases of the circulatory system: Secondary | ICD-10-CM | POA: Insufficient documentation

## 2019-06-25 DIAGNOSIS — Z91018 Allergy to other foods: Secondary | ICD-10-CM | POA: Insufficient documentation

## 2019-06-25 DIAGNOSIS — Z87440 Personal history of urinary (tract) infections: Secondary | ICD-10-CM | POA: Insufficient documentation

## 2019-06-25 DIAGNOSIS — Z3689 Encounter for other specified antenatal screening: Secondary | ICD-10-CM

## 2019-06-25 DIAGNOSIS — Z3A34 34 weeks gestation of pregnancy: Secondary | ICD-10-CM

## 2019-06-25 DIAGNOSIS — R1030 Lower abdominal pain, unspecified: Secondary | ICD-10-CM | POA: Insufficient documentation

## 2019-06-25 DIAGNOSIS — O26893 Other specified pregnancy related conditions, third trimester: Secondary | ICD-10-CM | POA: Insufficient documentation

## 2019-06-25 LAB — URINALYSIS, ROUTINE W REFLEX MICROSCOPIC
Bilirubin Urine: NEGATIVE
Glucose, UA: NEGATIVE mg/dL
Hgb urine dipstick: NEGATIVE
Ketones, ur: 5 mg/dL — AB
Leukocytes,Ua: NEGATIVE
Nitrite: NEGATIVE
Protein, ur: NEGATIVE mg/dL
Specific Gravity, Urine: 1.015 (ref 1.005–1.030)
pH: 7 (ref 5.0–8.0)

## 2019-06-25 LAB — WET PREP, GENITAL
Clue Cells Wet Prep HPF POC: NONE SEEN
Sperm: NONE SEEN
Trich, Wet Prep: NONE SEEN
Yeast Wet Prep HPF POC: NONE SEEN

## 2019-06-25 MED ORDER — ACETAMINOPHEN 325 MG PO TABS: 650.0000 mg | ORAL_TABLET | ORAL | 2 refills | Status: AC | PRN

## 2019-06-25 MED ORDER — ACETAMINOPHEN 500 MG PO TABS
1000.0000 mg | ORAL_TABLET | Freq: Once | ORAL | Status: AC
  Administered 2019-06-25: 14:00:00 1000 mg via ORAL
  Filled 2019-06-25: qty 2

## 2019-06-25 MED ORDER — CYCLOBENZAPRINE HCL 10 MG PO TABS: 10.0000 mg | ORAL_TABLET | Freq: Two times a day (BID) | ORAL | 0 refills | Status: DC | PRN

## 2019-06-25 NOTE — Discharge Instructions (Signed)

## 2019-06-25 NOTE — MAU Provider Note (Signed)
History     CSN: [id number]  Arrival date and time: 06/25/19 1323   First Provider Initiated Contact with Patient 06/25/19 1354      Chief Complaint  Patient presents with  . Contractions   HPI Linda Curry is a 24 y.o. G2P0010 at [redacted]w[redacted]d who presents to MAU with chief complaint of preterm contractions. This is a new problem, onset around 0200 today. Patient states her pain woke her up from sleeping then gradually improved without intervention. She rates her lower abdominal pain as 3/10 upon initial assessment in MAU. Pain is triggered by walking and repositioning. Patient states she has Googled Quarry manager" and is managing this discomfort as well. She has not taken medication or tried other treatments for this complaint. She denies vaginal bleeding, leaking of fluid, decreased fetal movement, fever, falls, or recent illness.   Patient receives care with [facility] and her next appointment is 07/03/2019.  OB History    Gravida  2   Para      Term      Preterm      AB  1   Living        SAB  1   TAB      Ectopic      Multiple      Live Births              Past Medical History:  Diagnosis Date  . Infection    UTI    Past Surgical History:  Procedure Laterality Date  . NO PAST SURGERIES      Family History  Problem Relation Age of Onset  . Asthma Mother   . Hypertension Mother   . Cancer Maternal Grandmother        pancreatic    Social History   Tobacco Use  . Smoking status: Never Smoker  . Smokeless tobacco: Never Used  Substance Use Topics  . Alcohol use: No  . Drug use: No    Allergies:  Allergies  Allergen Reactions  . Banana Anaphylaxis, Hives, Itching and Swelling    Throat swells    Medications Prior to Admission  Medication Sig Dispense Refill Last Dose  . Prenatal Vit-Fe Fumarate-FA (PRENATAL MULTIVITAMIN) TABS tablet Take 1 tablet by mouth daily at 12 noon.   06/25/2019 at 0900  . diphenhydrAMINE (BENADRYL) 25 MG  tablet Take 1 tablet (25 mg total) by mouth every 6 (six) hours as needed for up to 1 day. 4 tablet 0   . EPINEPHrine 0.3 mg/0.3 mL IJ SOAJ injection Inject 0.3 mLs (0.3 mg total) into the muscle as needed for anaphylaxis. 2 each 2   . famotidine (PEPCID) 20 MG tablet Take 1 tablet (20 mg total) by mouth 2 (two) times daily for 3 days. 6 tablet 0   . Pyridoxine HCl (B-6 PO) Take 1 tablet by mouth at bedtime.       Review of Systems  Constitutional: Negative for fatigue and fever.  Gastrointestinal: Positive for abdominal pain.  Genitourinary: Positive for pelvic pain. Negative for vaginal bleeding, vaginal discharge and vaginal pain.  All other systems reviewed and are negative.  Physical Exam   Blood pressure 112/64, pulse 80, temperature 98 F (36.7 C), resp. rate 16, weight 61.2 kg, last menstrual period 10/26/2018, SpO2 100 %, unknown if currently breastfeeding.  Physical Exam  Vitals reviewed. Constitutional: She is oriented to person, place, and time. She appears well-developed and well-nourished.  Cardiovascular: Normal rate.  Respiratory: Effort normal and breath  sounds normal.  GI: She exhibits no distension. There is no abdominal tenderness. There is no rebound, no guarding and no CVA tenderness.  Gravid  Genitourinary:    Genitourinary Comments: Cervix visually closed on sterile speculum exam. No bleeding or abnormal discharge noted. Closed/thick/posterior on digital exam, no CMT or contact bleeding.   Neurological: She is alert and oriented to person, place, and time.  Skin: Skin is warm and dry.  Psychiatric: She has a normal mood and affect. Her behavior is normal. Judgment and thought content normal.    MAU Course/MDM  Procedures  --Returned to bedside at 1500. Patient's pain score now 0/10 --Cervix remains closed 90 minutes after initial exam. Patient declines offer of additional period of monitoring to assess for cervical change. --Reactive tracing: baseline 140,  mod var, positive accels, no decels --Toco: rare contraction with uterine irritability, improving with PO hydration and rest  Patient Vitals for the past 24 hrs:  BP Temp Temp src Pulse Resp SpO2 Weight  06/25/19 1517 117/64 98.7 F (37.1 C) Oral 85 18 98 % --  06/25/19 1337 112/64 98 F (36.7 C) -- 80 16 100 % 61.2 kg   Results for orders placed or performed during the hospital encounter of 06/25/19 (from the past 24 hour(s))  Urinalysis, Routine w reflex microscopic     Status: Abnormal   Collection Time: 06/25/19  2:08 PM  Result Value Ref Range   Color, Urine YELLOW YELLOW   APPearance CLEAR CLEAR   Specific Gravity, Urine 1.015 1.005 - 1.030   pH 7.0 5.0 - 8.0   Glucose, UA NEGATIVE NEGATIVE mg/dL   Hgb urine dipstick NEGATIVE NEGATIVE   Bilirubin Urine NEGATIVE NEGATIVE   Ketones, ur 5 (A) NEGATIVE mg/dL   Protein, ur NEGATIVE NEGATIVE mg/dL   Nitrite NEGATIVE NEGATIVE   Leukocytes,Ua NEGATIVE NEGATIVE  Wet prep, genital     Status: Abnormal   Collection Time: 06/25/19  2:10 PM   Specimen: Cervix  Result Value Ref Range   Yeast Wet Prep HPF POC NONE SEEN NONE SEEN   Trich, Wet Prep NONE SEEN NONE SEEN   Clue Cells Wet Prep HPF POC NONE SEEN NONE SEEN   WBC, Wet Prep HPF POC MANY (A) NONE SEEN   Sperm NONE SEEN    Meds ordered this encounter  Medications  . acetaminophen (TYLENOL) tablet 1,000 mg  . cyclobenzaprine (FLEXERIL) 10 MG tablet    Sig: Take 1 tablet (10 mg total) by mouth 2 (two) times daily as needed for muscle spasms.    Dispense:  20 tablet    Refill:  0    Order Specific Question:   Supervising Provider    Answer:   ERVIN, MICHAEL L [[id number]]  . acetaminophen (TYLENOL) 325 MG tablet    Sig: Take 2 tablets (650 mg total) by mouth every 4 (four) hours as needed.    Dispense:  100 tablet    Refill:  2    Order Specific Question:   Supervising Provider    Answer:   Arlina Robes L [[id number]]   Assessment and Plan  --24 y.o. G2P0010 at [redacted]w[redacted]d  --Reactive  tracing --Closed cervix --Discharge home in stable condition with labor precautions  F/U: --Next appointment with [facility] 07/03/2019  [name], [place] 06/25/2019, 4:22 PM

## 2019-06-25 NOTE — MAU Note (Signed)
.   Linda Curry is a 24 y.o. at 105w4d here in MAU reporting: braxton hicks for 2 hours and called her physician and was told to come in and be evaluated.Pt reports an Increase in vaginal discharge. +FM LMP:   Onset of complaint: 2 hours Pain score:3 Vitals:   06/25/19 1337  BP: 112/64  Pulse: 80  Resp: 16  Temp: 98 F (36.7 C)  SpO2: 100%     FHT:145 Lab orders placed from triage: UA

## 2019-07-02 ENCOUNTER — Inpatient Hospital Stay (HOSPITAL_COMMUNITY)
Admission: AD | Admit: 2019-07-02 | Discharge: 2019-07-02 | Disposition: A | Discharge status: Home / Self Care | Payer: BC Managed Care – PPO | Attending: Obstetrics & Gynecology | Admitting: Obstetrics & Gynecology

## 2019-07-02 ENCOUNTER — Other Ambulatory Visit: Payer: Self-pay

## 2019-07-02 ENCOUNTER — Encounter (HOSPITAL_COMMUNITY): Payer: Self-pay | Admitting: Obstetrics & Gynecology

## 2019-07-02 DIAGNOSIS — Z0371 Encounter for suspected problem with amniotic cavity and membrane ruled out: Secondary | ICD-10-CM

## 2019-07-02 DIAGNOSIS — O26893 Other specified pregnancy related conditions, third trimester: Secondary | ICD-10-CM | POA: Diagnosis not present

## 2019-07-02 DIAGNOSIS — Z3A35 35 weeks gestation of pregnancy: Secondary | ICD-10-CM | POA: Insufficient documentation

## 2019-07-02 LAB — URINALYSIS, ROUTINE W REFLEX MICROSCOPIC
Bilirubin Urine: NEGATIVE
Glucose, UA: NEGATIVE mg/dL
Ketones, ur: NEGATIVE mg/dL
Leukocytes,Ua: NEGATIVE
Nitrite: NEGATIVE
Protein, ur: NEGATIVE mg/dL
Specific Gravity, Urine: 1.002 — ABNORMAL LOW (ref 1.005–1.030)
pH: 7 (ref 5.0–8.0)

## 2019-07-02 LAB — POCT FERN TEST: POCT Fern Test: NEGATIVE

## 2019-07-02 LAB — AMNISURE RUPTURE OF MEMBRANE (ROM) NOT AT ARMC: Amnisure ROM: NEGATIVE

## 2019-07-02 NOTE — Discharge Instructions (Signed)
Reasons to return to MAU at [facility]:  Since you are preterm, return to MAU if:  1.  Contractions are 10 minutes apart or less and they becoming more uncomfortable or painful over time 2.  You have a large gush of fluid, or a trickle of fluid that will not stop and you have to wear a pad 3.  You have bleeding that is bright red, heavier than spotting--like menstrual bleeding (spotting can be normal in early labor or after a check of your cervix) 4.  You do not feel the baby moving like he/she normally does  

## 2019-07-02 NOTE — MAU Provider Note (Signed)
Chief Complaint:  Rupture of Membranes and Contractions   First Provider Initiated Contact with Patient 07/02/19 1109      HPI: Linda Curry is a 24 y.o. G2P0010 at [redacted]w[redacted]d who presents to maternity admissions reporting a gush of fluid while at work this morning.  She reports she felt a gush and went to the bathroom. She had soaked her underwear and onto her tights a little bit. There has been scant leakage since this episode. She reports that she started having some intermittent, irregular cramping, mostly in her low back last night.  It is unchanged today. The baby is moving well.     HPI  Past Medical History: Past Medical History:  Diagnosis Date  . Infection    UTI    Past obstetric history: OB History  Gravida Para Term Preterm AB Living  2       1    SAB TAB Ectopic Multiple Live Births  1            # Outcome Date GA Lbr Len/2nd Weight Sex Delivery Anes PTL Lv  2 Current           1 SAB             Past Surgical History: Past Surgical History:  Procedure Laterality Date  . NO PAST SURGERIES      Family History: Family History  Problem Relation Age of Onset  . Asthma Mother   . Hypertension Mother   . Healthy Father   . Cancer Maternal Grandmother        pancreatic    Social History: Social History   Tobacco Use  . Smoking status: Never Smoker  . Smokeless tobacco: Never Used  Substance Use Topics  . Alcohol use: No  . Drug use: No    Allergies:  Allergies  Allergen Reactions  . Banana Anaphylaxis, Hives, Itching and Swelling    Throat swells    Meds:  Medications Prior to Admission  Medication Sig Dispense Refill Last Dose  . Prenatal Vit-Fe Fumarate-FA (PRENATAL MULTIVITAMIN) TABS tablet Take 1 tablet by mouth daily at 12 noon.   07/01/2019 at 2100  . acetaminophen (TYLENOL) 325 MG tablet Take 2 tablets (650 mg total) by mouth every 4 (four) hours as needed. 100 tablet 2 06/29/2019  . cyclobenzaprine (FLEXERIL) 10 MG tablet Take 1 tablet  (10 mg total) by mouth 2 (two) times daily as needed for muscle spasms. 20 tablet 0   . diphenhydrAMINE (BENADRYL) 25 MG tablet Take 1 tablet (25 mg total) by mouth every 6 (six) hours as needed for up to 1 day. 4 tablet 0   . EPINEPHrine 0.3 mg/0.3 mL IJ SOAJ injection Inject 0.3 mLs (0.3 mg total) into the muscle as needed for anaphylaxis. 2 each 2   . famotidine (PEPCID) 20 MG tablet Take 1 tablet (20 mg total) by mouth 2 (two) times daily for 3 days. 6 tablet 0   . Pyridoxine HCl (B-6 PO) Take 1 tablet by mouth at bedtime.       ROS:  Review of Systems  Constitutional: Negative for chills, fatigue and fever.  Eyes: Negative for visual disturbance.  Respiratory: Negative for shortness of breath.   Cardiovascular: Negative for chest pain.  Gastrointestinal: Negative for abdominal pain, nausea and vomiting.  Genitourinary: Positive for vaginal discharge. Negative for difficulty urinating, dysuria, flank pain, pelvic pain, vaginal bleeding and vaginal pain.  Neurological: Negative for dizziness and headaches.  Psychiatric/Behavioral: Negative.  I have reviewed patient's Past Medical Hx, Surgical Hx, Family Hx, Social Hx, medications and allergies.   Physical Exam   Patient Vitals for the past 24 hrs:  BP Temp Temp src Pulse Resp SpO2 Height Weight  07/02/19 1248 122/69 98.5 F (36.9 C) Oral 98 19 99 % -- --  07/02/19 0957 107/66 98.3 F (36.8 C) Oral 88 20 98 % -- --  07/02/19 0950 -- -- -- -- -- -- 4' 11.5" (1.511 m) 62.9 kg   Constitutional: Well-developed, well-nourished female in no acute distress.  Cardiovascular: normal rate Respiratory: normal effort GI: Abd soft, non-tender, gravid appropriate for gestational age.  MS: Extremities nontender, no edema, normal ROM Neurologic: Alert and oriented x 4.  GU: Neg CVAT.  PELVIC EXAM: Cervix pink, visually closed, without lesion, scant white creamy discharge, no pooling of fluid with valsalva, vaginal walls and external  genitalia normal  Ferning negative on slide taken during SSE      FHT:  Baseline 135 , moderate variability, accelerations present, no decelerations Contractions: irritability, mild to palpation   Labs: Results for orders placed or performed during the hospital encounter of 07/02/19 (from the past 24 hour(s))  Urinalysis, Routine w reflex microscopic     Status: Abnormal   Collection Time: 07/02/19 10:10 AM  Result Value Ref Range   Color, Urine STRAW (A) YELLOW   APPearance CLEAR CLEAR   Specific Gravity, Urine 1.002 (L) 1.005 - 1.030   pH 7.0 5.0 - 8.0   Glucose, UA NEGATIVE NEGATIVE mg/dL   Hgb urine dipstick SMALL (A) NEGATIVE   Bilirubin Urine NEGATIVE NEGATIVE   Ketones, ur NEGATIVE NEGATIVE mg/dL   Protein, ur NEGATIVE NEGATIVE mg/dL   Nitrite NEGATIVE NEGATIVE   Leukocytes,Ua NEGATIVE NEGATIVE   RBC / HPF 0-5 0 - 5 RBC/hpf   WBC, UA 0-5 0 - 5 WBC/hpf   Bacteria, UA FEW (A) NONE SEEN   Squamous Epithelial / LPF 0-5 0 - 5  Fern Test     Status: None   Collection Time: 07/02/19 11:48 AM  Result Value Ref Range   POCT Fern Test Negative = intact amniotic membranes   Amnisure rupture of membrane (rom)not at [facility]     Status: None   Collection Time: 07/02/19 11:57 AM  Result Value Ref Range   Amnisure ROM NEGATIVE       Imaging:  No results found.  MAU Course/MDM: Orders Placed This Encounter  Procedures  . Urinalysis, Routine w reflex microscopic  . Amnisure rupture of membrane (rom)not at [facility]  . Fern Test  . Discharge patient    No orders of the defined types were placed in this encounter.    NST reviewed and reactive There was negative pooling and ferning but with pt report of fluid soaking through to her clothes, added amnisure for additional confirmation Amnisure negative, so no evidence of ROM or of preterm labor   PTL precautions given, pt to f/u tomorrow as scheduled at [facility]  Return to MAU as needed for emergencies     Assessment: 1.  Encounter for suspected premature rupture of amniotic membranes, with rupture of membranes not found     Plan: Discharge home Labor precautions and fetal kick counts Follow-up Information    Gynecology, [facility].   Specialty: Obstetrics and Gynecology Why: As scheduled, return to MAU as needed for emergencies. Contact information: [address] [place] [address] [phone]  Allergies as of 07/02/2019      Reactions   Banana Anaphylaxis, Hives, Itching, Swelling   Throat swells      Medication List    TAKE these medications   acetaminophen 325 MG tablet Commonly known as: Tylenol Take 2 tablets (650 mg total) by mouth every 4 (four) hours as needed.   B-6 PO Take 1 tablet by mouth at bedtime.   cyclobenzaprine 10 MG tablet Commonly known as: FLEXERIL Take 1 tablet (10 mg total) by mouth 2 (two) times daily as needed for muscle spasms.   diphenhydrAMINE 25 MG tablet Commonly known as: BENADRYL Take 1 tablet (25 mg total) by mouth every 6 (six) hours as needed for up to 1 day.   EPINEPHrine 0.3 mg/0.3 mL Soaj injection Commonly known as: EPI-PEN Inject 0.3 mLs (0.3 mg total) into the muscle as needed for anaphylaxis.   famotidine 20 MG tablet Commonly known as: PEPCID Take 1 tablet (20 mg total) by mouth 2 (two) times daily for 3 days.   prenatal multivitamin Tabs tablet Take 1 tablet by mouth daily at 12 noon.       Sharen Counter Certified Nurse-Midwife 07/02/2019 1:02 PM

## 2019-07-02 NOTE — MAU Note (Addendum)
Presents with c/o LOF since 0840, reports fluid clear and saturated underwear.  Also states having ctxs.  Denies VB.  Reports +FM, but not as much as usual.  Hasn't eaten anything this morning.

## 2019-07-03 LAB — OB RESULTS CONSOLE GBS: GBS: POSITIVE

## 2019-07-16 ENCOUNTER — Other Ambulatory Visit: Payer: Self-pay

## 2019-07-16 ENCOUNTER — Inpatient Hospital Stay (HOSPITAL_COMMUNITY)
Admission: AD | Admit: 2019-07-16 | Discharge: 2019-07-16 | Disposition: A | Discharge status: Home / Self Care | Payer: BC Managed Care – PPO | Attending: Obstetrics & Gynecology | Admitting: Obstetrics & Gynecology

## 2019-07-16 ENCOUNTER — Encounter (HOSPITAL_COMMUNITY): Payer: Self-pay | Admitting: Obstetrics & Gynecology

## 2019-07-16 DIAGNOSIS — O26893 Other specified pregnancy related conditions, third trimester: Secondary | ICD-10-CM | POA: Diagnosis not present

## 2019-07-16 DIAGNOSIS — Z3A37 37 weeks gestation of pregnancy: Secondary | ICD-10-CM | POA: Insufficient documentation

## 2019-07-16 DIAGNOSIS — O479 False labor, unspecified: Secondary | ICD-10-CM

## 2019-07-16 DIAGNOSIS — Z0371 Encounter for suspected problem with amniotic cavity and membrane ruled out: Secondary | ICD-10-CM | POA: Diagnosis not present

## 2019-07-16 LAB — URINALYSIS, ROUTINE W REFLEX MICROSCOPIC
Bilirubin Urine: NEGATIVE
Glucose, UA: >=500 mg/dL — AB
Hgb urine dipstick: NEGATIVE
Ketones, ur: NEGATIVE mg/dL
Leukocytes,Ua: NEGATIVE
Nitrite: NEGATIVE
Protein, ur: NEGATIVE mg/dL
Specific Gravity, Urine: 1.012 (ref 1.005–1.030)
pH: 6 (ref 5.0–8.0)

## 2019-07-16 LAB — POCT FERN TEST: POCT Fern Test: NEGATIVE

## 2019-07-16 NOTE — MAU Note (Signed)
.   Linda Curry is a 24 y.o. at [redacted]w[redacted]d here in MAU reporting: she had leakage of fluid all morning while she was at work. Also reports that her urine was had a discoloration to it. Denies any pain of VB. Has not had to wear a peri pad  Onset of complaint: all morning Pain score: 0 Vitals:   07/16/19 1818 07/16/19 1819  BP:  114/64  Pulse:  74  Resp:  18  Temp:  98.2 F (36.8 C)  SpO2: 100% 100%     FHT:145 Lab orders placed from triage: UA

## 2019-07-16 NOTE — MAU Provider Note (Signed)
First Provider Initiated Contact with Patient 07/16/19 1859     S: Ms. Linda Curry is a 24 y.o. G2P0010 at [redacted]w[redacted]d  who presents to MAU today complaining of leaking of fluid since this AM. She denies vaginal bleeding. She endorses contractions. She reports normal fetal movement.  States her urine looked different this morning than normal; like there was something mixed into the urine. Has not had any large gushes of fluid.   O: BP 114/64   Pulse 74   Temp 98.2 F (36.8 C)   Resp 18   Wt 64.4 kg   LMP 10/26/2018   SpO2 100%   BMI 28.20 kg/m  GENERAL: Well-developed, well-nourished female in no acute distress.  HEAD: Normocephalic, atraumatic.  CHEST: Normal effort of breathing, regular heart rate ABDOMEN: Soft, nontender, gravid PELVIC: Normal external female genitalia. Vagina is pink and rugated. Cervix with normal contour, no lesions. Normal discharge.  negative pooling.  Cervical exam:  Dilation: 1 Effacement (%): Thick Cervical Position: Posterior Station: -3 Exam by:: Zenia Resides, RN   Fetal Monitoring: Baseline: 140 bpm Variability: Moderate  Accelerations: 15x15 Decelerations: none Contractions: UI  Results for orders placed or performed during the hospital encounter of 07/16/19 (from the past 24 hour(s))  Urinalysis, Routine w reflex microscopic     Status: Abnormal   Collection Time: 07/16/19  6:44 PM  Result Value Ref Range   Color, Urine YELLOW YELLOW   APPearance CLEAR CLEAR   Specific Gravity, Urine 1.012 1.005 - 1.030   pH 6.0 5.0 - 8.0   Glucose, UA >=500 (A) NEGATIVE mg/dL   Hgb urine dipstick NEGATIVE NEGATIVE   Bilirubin Urine NEGATIVE NEGATIVE   Ketones, ur NEGATIVE NEGATIVE mg/dL   Protein, ur NEGATIVE NEGATIVE mg/dL   Nitrite NEGATIVE NEGATIVE   Leukocytes,Ua NEGATIVE NEGATIVE   RBC / HPF 0-5 0 - 5 RBC/hpf   WBC, UA 0-5 0 - 5 WBC/hpf   Bacteria, UA FEW (A) NONE SEEN   Squamous Epithelial / LPF 0-5 0 - 5  Fern Test     Status: None   Collection Time: 07/16/19  7:46 PM  Result Value Ref Range   POCT Fern Test Negative = intact amniotic membranes      A: SIUP at [redacted]w[redacted]d  Membranes intact  Fern negative   P: Patient comfortable in bed. She has f/u scheduled on Friday in the office and plans to keep that appointment.   Labor precautions   Audriella Blakeley, Harolyn Rutherford, NP 07/16/2019 7:07 PM

## 2019-07-28 ENCOUNTER — Encounter (HOSPITAL_COMMUNITY): Payer: Self-pay | Admitting: *Deleted

## 2019-07-28 ENCOUNTER — Telehealth (HOSPITAL_COMMUNITY): Payer: Self-pay | Admitting: *Deleted

## 2019-07-28 NOTE — Telephone Encounter (Signed)
Preadmission screen  

## 2019-07-29 ENCOUNTER — Inpatient Hospital Stay (HOSPITAL_COMMUNITY): Payer: BC Managed Care – PPO | Admitting: Anesthesiology

## 2019-07-29 ENCOUNTER — Inpatient Hospital Stay (EMERGENCY_DEPARTMENT_HOSPITAL)
Admission: AD | Admit: 2019-07-29 | Discharge: 2019-07-29 | Disposition: A | Discharge status: Home / Self Care | Payer: BC Managed Care – PPO | Source: Home / Self Care | Attending: Obstetrics and Gynecology | Admitting: Obstetrics and Gynecology

## 2019-07-29 ENCOUNTER — Encounter (HOSPITAL_COMMUNITY): Payer: Self-pay | Admitting: Obstetrics and Gynecology

## 2019-07-29 ENCOUNTER — Other Ambulatory Visit: Payer: Self-pay

## 2019-07-29 ENCOUNTER — Inpatient Hospital Stay (HOSPITAL_COMMUNITY)
Admission: AD | Admit: 2019-07-29 | Discharge: 2019-07-31 | DRG: 807 | Disposition: A | Discharge status: Home / Self Care | Payer: BC Managed Care – PPO | Attending: Obstetrics and Gynecology | Admitting: Obstetrics and Gynecology

## 2019-07-29 DIAGNOSIS — R011 Cardiac murmur, unspecified: Secondary | ICD-10-CM | POA: Diagnosis not present

## 2019-07-29 DIAGNOSIS — Z3A39 39 weeks gestation of pregnancy: Secondary | ICD-10-CM | POA: Diagnosis not present

## 2019-07-29 DIAGNOSIS — O471 False labor at or after 37 completed weeks of gestation: Secondary | ICD-10-CM

## 2019-07-29 DIAGNOSIS — D649 Anemia, unspecified: Secondary | ICD-10-CM | POA: Diagnosis present

## 2019-07-29 DIAGNOSIS — Z23 Encounter for immunization: Secondary | ICD-10-CM | POA: Diagnosis not present

## 2019-07-29 DIAGNOSIS — Z3A38 38 weeks gestation of pregnancy: Secondary | ICD-10-CM

## 2019-07-29 DIAGNOSIS — O99824 Streptococcus B carrier state complicating childbirth: Secondary | ICD-10-CM | POA: Diagnosis not present

## 2019-07-29 DIAGNOSIS — Z20822 Contact with and (suspected) exposure to covid-19: Secondary | ICD-10-CM | POA: Diagnosis not present

## 2019-07-29 DIAGNOSIS — O26893 Other specified pregnancy related conditions, third trimester: Secondary | ICD-10-CM | POA: Diagnosis not present

## 2019-07-29 DIAGNOSIS — O9902 Anemia complicating childbirth: Principal | ICD-10-CM | POA: Diagnosis present

## 2019-07-29 LAB — CBC
HCT: 34.6 % — ABNORMAL LOW (ref 36.0–46.0)
Hemoglobin: 11.4 g/dL — ABNORMAL LOW (ref 12.0–15.0)
MCH: 26.3 pg (ref 26.0–34.0)
MCHC: 32.9 g/dL (ref 30.0–36.0)
MCV: 79.7 fL — ABNORMAL LOW (ref 80.0–100.0)
Platelets: 224 10*3/uL (ref 150–400)
RBC: 4.34 MIL/uL (ref 3.87–5.11)
RDW: 13.8 % (ref 11.5–15.5)
WBC: 13 10*3/uL — ABNORMAL HIGH (ref 4.0–10.5)
nRBC: 0 % (ref 0.0–0.2)

## 2019-07-29 LAB — TYPE AND SCREEN
ABO/RH(D): A POS
Antibody Screen: NEGATIVE

## 2019-07-29 LAB — SARS CORONAVIRUS 2 (TAT 6-24 HRS): SARS Coronavirus 2: NEGATIVE

## 2019-07-29 MED ORDER — PENICILLIN G POT IN DEXTROSE 60000 UNIT/ML IV SOLN: 3.0000 10*6.[IU] | INTRAVENOUS | Status: DC

## 2019-07-29 MED ORDER — FENTANYL CITRATE (PF) 100 MCG/2ML IJ SOLN
INTRAMUSCULAR | Status: DC | PRN
  Administered 2019-07-29: 15 ug via INTRATHECAL

## 2019-07-29 MED ORDER — HYDROXYZINE HCL 50 MG PO TABS: 50.0000 mg | ORAL_TABLET | Freq: Four times a day (QID) | ORAL | Status: DC | PRN

## 2019-07-29 MED ORDER — OXYTOCIN BOLUS FROM INFUSION: 500.0000 mL | Freq: Once | INTRAVENOUS | Status: DC

## 2019-07-29 MED ORDER — DIBUCAINE (PERIANAL) 1 % EX OINT: 1.0000 "application " | TOPICAL_OINTMENT | CUTANEOUS | Status: DC | PRN

## 2019-07-29 MED ORDER — IBUPROFEN 600 MG PO TABS
600.0000 mg | ORAL_TABLET | Freq: Four times a day (QID) | ORAL | Status: DC
  Administered 2019-07-29 – 2019-07-31 (×7): 600 mg via ORAL
  Filled 2019-07-29 (×8): qty 1

## 2019-07-29 MED ORDER — LIDOCAINE HCL (PF) 1 % IJ SOLN: 30.0000 mL | INTRAMUSCULAR | Status: DC | PRN

## 2019-07-29 MED ORDER — SENNOSIDES-DOCUSATE SODIUM 8.6-50 MG PO TABS
2.0000 | ORAL_TABLET | ORAL | Status: DC
  Administered 2019-07-30 (×2): 2 via ORAL
  Filled 2019-07-29 (×2): qty 2

## 2019-07-29 MED ORDER — PRENATAL MULTIVITAMIN CH
1.0000 | ORAL_TABLET | Freq: Every day | ORAL | Status: DC
  Administered 2019-07-30 – 2019-07-31 (×2): 1 via ORAL
  Filled 2019-07-29 (×2): qty 1

## 2019-07-29 MED ORDER — EPHEDRINE 5 MG/ML INJ: 10.0000 mg | INTRAVENOUS | Status: DC | PRN

## 2019-07-29 MED ORDER — OXYCODONE-ACETAMINOPHEN 5-325 MG PO TABS: 2.0000 | ORAL_TABLET | ORAL | Status: DC | PRN

## 2019-07-29 MED ORDER — ACETAMINOPHEN 325 MG PO TABS: 650.0000 mg | ORAL_TABLET | ORAL | Status: DC | PRN

## 2019-07-29 MED ORDER — DIPHENHYDRAMINE HCL 50 MG/ML IJ SOLN: 12.5000 mg | INTRAMUSCULAR | Status: DC | PRN

## 2019-07-29 MED ORDER — ONDANSETRON HCL 4 MG/2ML IJ SOLN
INTRAMUSCULAR | Status: AC
  Administered 2019-07-29: 4 mg
  Filled 2019-07-29: qty 2

## 2019-07-29 MED ORDER — LACTATED RINGERS IV SOLN: INTRAVENOUS | Status: DC

## 2019-07-29 MED ORDER — WITCH HAZEL-GLYCERIN EX PADS: 1.0000 "application " | MEDICATED_PAD | CUTANEOUS | Status: DC | PRN

## 2019-07-29 MED ORDER — FENTANYL-BUPIVACAINE-NACL 0.5-0.125-0.9 MG/250ML-% EP SOLN
EPIDURAL | Status: AC
  Filled 2019-07-29: qty 250

## 2019-07-29 MED ORDER — PHENYLEPHRINE 40 MCG/ML (10ML) SYRINGE FOR IV PUSH (FOR BLOOD PRESSURE SUPPORT): 80.0000 ug | PREFILLED_SYRINGE | INTRAVENOUS | Status: DC | PRN

## 2019-07-29 MED ORDER — ZOLPIDEM TARTRATE 5 MG PO TABS: 5.0000 mg | ORAL_TABLET | Freq: Every evening | ORAL | Status: DC | PRN

## 2019-07-29 MED ORDER — FENTANYL CITRATE (PF) 100 MCG/2ML IJ SOLN
50.0000 ug | INTRAMUSCULAR | Status: DC | PRN
  Filled 2019-07-29: qty 2

## 2019-07-29 MED ORDER — BENZOCAINE-MENTHOL 20-0.5 % EX AERO: 1.0000 "application " | INHALATION_SPRAY | CUTANEOUS | Status: DC | PRN

## 2019-07-29 MED ORDER — LACTATED RINGERS IV SOLN: 500.0000 mL | Freq: Once | INTRAVENOUS | Status: DC

## 2019-07-29 MED ORDER — OXYTOCIN 40 UNITS IN NORMAL SALINE INFUSION - SIMPLE MED
2.5000 [IU]/h | INTRAVENOUS | Status: DC
  Filled 2019-07-29: qty 1000

## 2019-07-29 MED ORDER — SIMETHICONE 80 MG PO CHEW: 80.0000 mg | CHEWABLE_TABLET | ORAL | Status: DC | PRN

## 2019-07-29 MED ORDER — PHENYLEPHRINE 40 MCG/ML (10ML) SYRINGE FOR IV PUSH (FOR BLOOD PRESSURE SUPPORT)
PREFILLED_SYRINGE | INTRAVENOUS | Status: AC
  Filled 2019-07-29: qty 10

## 2019-07-29 MED ORDER — METHYLERGONOVINE MALEATE 0.2 MG/ML IJ SOLN: 0.2000 mg | INTRAMUSCULAR | Status: DC | PRN

## 2019-07-29 MED ORDER — ONDANSETRON HCL 4 MG/2ML IJ SOLN: 4.0000 mg | Freq: Four times a day (QID) | INTRAMUSCULAR | Status: DC | PRN

## 2019-07-29 MED ORDER — SODIUM CHLORIDE 0.9 % IV SOLN
5.0000 10*6.[IU] | Freq: Once | INTRAVENOUS | Status: DC
  Filled 2019-07-29: qty 5

## 2019-07-29 MED ORDER — OXYCODONE-ACETAMINOPHEN 5-325 MG PO TABS: 1.0000 | ORAL_TABLET | ORAL | Status: DC | PRN

## 2019-07-29 MED ORDER — SOD CITRATE-CITRIC ACID 500-334 MG/5ML PO SOLN: 30.0000 mL | ORAL | Status: DC | PRN

## 2019-07-29 MED ORDER — SODIUM CHLORIDE 0.9 % IV SOLN
2.0000 g | Freq: Four times a day (QID) | INTRAVENOUS | Status: DC
  Administered 2019-07-29: 2 g via INTRAVENOUS
  Filled 2019-07-29 (×4): qty 2000

## 2019-07-29 MED ORDER — ONDANSETRON HCL 4 MG/2ML IJ SOLN: 4.0000 mg | INTRAMUSCULAR | Status: DC | PRN

## 2019-07-29 MED ORDER — LACTATED RINGERS IV SOLN: 500.0000 mL | INTRAVENOUS | Status: DC | PRN

## 2019-07-29 MED ORDER — COCONUT OIL OIL: 1.0000 "application " | TOPICAL_OIL | Status: DC | PRN

## 2019-07-29 MED ORDER — BUPIVACAINE HCL (PF) 0.25 % IJ SOLN
INTRAMUSCULAR | Status: DC | PRN
  Administered 2019-07-29: 1 mL via INTRATHECAL

## 2019-07-29 MED ORDER — FENTANYL-BUPIVACAINE-NACL 0.5-0.125-0.9 MG/250ML-% EP SOLN: 12.0000 mL/h | EPIDURAL | Status: DC | PRN

## 2019-07-29 MED ORDER — ONDANSETRON HCL 4 MG PO TABS: 4.0000 mg | ORAL_TABLET | ORAL | Status: DC | PRN

## 2019-07-29 MED ORDER — METHYLERGONOVINE MALEATE 0.2 MG PO TABS: 0.2000 mg | ORAL_TABLET | ORAL | Status: DC | PRN

## 2019-07-29 MED ORDER — DIPHENHYDRAMINE HCL 25 MG PO CAPS: 25.0000 mg | ORAL_CAPSULE | Freq: Four times a day (QID) | ORAL | Status: DC | PRN

## 2019-07-29 NOTE — H&P (Signed)
Linda Curry is a 24 y.o. female G2P010 at 20 wks and 3 days presenting for active labor.. called to labor and delivery due to rapid progression from 5 cm to complete. Prenatal care provided by Dr. Gerald Leitz . Pregnancy has been uncomplicated.   . OB History    Gravida  2   Para  1   Term  1   Preterm      AB  1   Living  1     SAB  1   TAB      Ectopic      Multiple  0   Live Births  1          Past Medical History:  Diagnosis Date  . Infection    UTI   Past Surgical History:  Procedure Laterality Date  . NO PAST SURGERIES     Family History: family history includes Asthma in her mother; Cancer in her maternal grandfather and maternal grandmother; Diabetes in her maternal grandmother; Healthy in her father; Hypertension in her mother. Social History:  reports that she has never smoked. She has never used smokeless tobacco. She reports that she does not drink alcohol or use drugs.     Maternal Diabetes: No Genetic Screening: Normal Maternal Ultrasounds/Referrals: Normal Fetal Ultrasounds or other Referrals:  None Maternal Substance Abuse:  No Significant Maternal Medications:  None Significant Maternal Lab Results:  Group B Strep positive Other Comments:  None  Review of Systems  Constitutional: Negative.   HENT: Negative.   Eyes: Negative.   Respiratory: Negative.   Cardiovascular: Negative.   Gastrointestinal: Negative.   Endocrine: Negative.   Genitourinary: Negative.   Musculoskeletal: Negative.   Allergic/Immunologic: Negative.   Neurological: Negative.   Hematological: Negative.   Psychiatric/Behavioral: Negative.    Maternal Medical History:  Reason for admission: Contractions.   Contractions: Onset was yesterday.   Frequency: regular.   Perceived severity is strong.    Fetal activity: Perceived fetal activity is normal.   Last perceived fetal movement was within the past hour.    Prenatal complications: no prenatal  complications Prenatal Complications - Diabetes: none.    Dilation: 10 Effacement (%): 100 Station: Plus 2 Exam by:: Concha Se Blood pressure 117/65, pulse 63, temperature 97.9 F (36.6 C), temperature source Oral, resp. rate 16, last menstrual period 10/26/2018, SpO2 96 %, unknown if currently breastfeeding. Exam Physical Exam  Vitals reviewed. Constitutional: She appears well-developed and well-nourished.  HENT:  Head: Normocephalic and atraumatic.  Eyes: Pupils are equal, round, and reactive to light. Conjunctivae are normal.  Cardiovascular: Normal rate and regular rhythm.  Respiratory: Effort normal and breath sounds normal.  GI: There is no abdominal tenderness.  Genitourinary:    Vagina normal.   Musculoskeletal:        General: Edema present. Normal range of motion.     Cervical back: Normal range of motion and neck supple.  Skin: Skin is warm and dry.  Psychiatric: She has a normal mood and affect.    Prenatal labs: ABO, Rh: --/--/A POS (03/30 1150) Antibody: NEG (03/30 1150) Rubella: Immune (08/04 0000) RPR: Nonreactive (08/04 0000)  HBsAg: Negative (08/04 0000)  HIV: Non-reactive (08/04 0000)  GBS: Positive/-- (03/04 0000)   Assessment/Plan: 39 wks and 3 days in active labor Ampicillin for gbs prophylaxis  Low dose spinal given for pain management by Dr. Collins [place] SVD- see Delivery note    Gerald Leitz 07/29/2019, 7:58 PM

## 2019-07-29 NOTE — Anesthesia Preprocedure Evaluation (Signed)
Anesthesia Evaluation  Patient identified by MRN, date of birth, ID band Patient awake    Reviewed: Allergy & Precautions, Patient's Chart, lab work & pertinent test results  History of Anesthesia Complications Negative for: history of anesthetic complications  Airway Mallampati: II  TM Distance: >3 FB Neck ROM: Full    Dental no notable dental hx.    Pulmonary neg pulmonary ROS,    Pulmonary exam normal        Cardiovascular negative cardio ROS Normal cardiovascular exam     Neuro/Psych negative neurological ROS  negative psych ROS   GI/Hepatic negative GI ROS, Neg liver ROS,   Endo/Other  negative endocrine ROS  Renal/GU negative Renal ROS  negative genitourinary   Musculoskeletal negative musculoskeletal ROS (+)   Abdominal   Peds  Hematology negative hematology ROS (+) anemia ,   Anesthesia Other Findings Day of surgery medications reviewed with patient.  Reproductive/Obstetrics (+) Pregnancy                             Anesthesia Physical Anesthesia Plan  ASA: II  Anesthesia Plan: Spinal   Post-op Pain Management:    Induction:   PONV Risk Score and Plan: Treatment may vary due to age or medical condition  Airway Management Planned: Natural Airway  Additional Equipment:   Intra-op Plan:   Post-operative Plan:   Informed Consent: I have reviewed the patients History and Physical, chart, labs and discussed the procedure including the risks, benefits and alternatives for the proposed anesthesia with the patient or authorized representative who has indicated his/her understanding and acceptance.       Plan Discussed with:   Anesthesia Plan Comments: (Pt in active labor. Cervical exam 10cm and urge to push. SAB placed. No epidural catheter. Stephannie Peters, MD)        Anesthesia Quick Evaluation

## 2019-07-29 NOTE — MAU Note (Signed)
Patient presents to MAU with c/o contractions every minute, denies LOF, denies having any vaginal bleeding.

## 2019-07-29 NOTE — Anesthesia Procedure Notes (Signed)
Spinal  Patient location during procedure: OB Start time: 07/29/2019 12:28 PM End time: 07/29/2019 12:31 PM Reason for block: procedure for pain Staffing Performed: anesthesiologist  Anesthesiologist: Kaylyn Layer, MD Preanesthetic Checklist Completed: patient identified, IV checked, risks and benefits discussed, monitors and equipment checked, pre-op evaluation and timeout performed Spinal Block Patient position: right lateral decubitus Prep: DuraPrep and site prepped and draped Patient monitoring: heart rate, continuous pulse ox and blood pressure Approach: midline Location: L3-4 Injection technique: single-shot Needle Needle type: Pencan  Needle gauge: 24 G Needle length: 10 cm Additional Notes Pt with precipitous labor, cervical exam 10cm, urge to push. Risks, benefits, and alternative discussed. Patient gave consent to procedure. Prepped and draped in right lateral position. Clear CSF obtained after one needle pass. Positive terminal aspiration. No pain or paraesthesias with injection. Patient tolerated procedure well. Vital signs stable. Pt comfortable with contractions, delivery of viable infant while I was still in room. Amalia Greenhouse, MD

## 2019-07-29 NOTE — Anesthesia Postprocedure Evaluation (Signed)
Anesthesia Post Note  Patient: Linda Curry  Procedure(s) Performed: AN AD HOC LABOR EPIDURAL     Patient location during evaluation: Mother Baby Anesthesia Type: Spinal Level of consciousness: oriented and awake and alert Pain management: pain level controlled Vital Signs Assessment: post-procedure vital signs reviewed and stable Respiratory status: spontaneous breathing and respiratory function stable Cardiovascular status: blood pressure returned to baseline and stable Postop Assessment: no headache, no backache, no apparent nausea or vomiting and able to ambulate Anesthetic complications: no    Last Vitals:  Vitals:   07/29/19 1412 07/29/19 1601  BP:  117/65  Pulse:  63  Resp:  16  Temp:  36.6 C  SpO2: 100% 96%    Last Pain:  Vitals:   07/29/19 1601  TempSrc: Oral  PainSc: 0-No pain   Pain Goal:                   Ulysess Witz

## 2019-07-29 NOTE — MAU Provider Note (Signed)
Ms. Linda Curry is a G2P0010 at [redacted]w[redacted]d seen in MAU for labor. RN labor check, not seen by provider. SVE by RN Dilation: 2 Effacement (%): 90 Station: -2 Exam by:: lauren cox rn   NST - FHR: 130 bpm / moderate variability / accels present / decels absent / TOCO: UI noted   Plan:  D/C home with labor precautions Keep scheduled appt with [facility] on 07/29/2019 @ [address], [place]  07/29/2019 7:19 AM

## 2019-07-30 LAB — CBC
HCT: 31.8 % — ABNORMAL LOW (ref 36.0–46.0)
Hemoglobin: 10.5 g/dL — ABNORMAL LOW (ref 12.0–15.0)
MCH: 26.3 pg (ref 26.0–34.0)
MCHC: 33 g/dL (ref 30.0–36.0)
MCV: 79.7 fL — ABNORMAL LOW (ref 80.0–100.0)
Platelets: 227 K/uL (ref 150–400)
RBC: 3.99 MIL/uL (ref 3.87–5.11)
RDW: 13.9 % (ref 11.5–15.5)
WBC: 14.4 K/uL — ABNORMAL HIGH (ref 4.0–10.5)
nRBC: 0 % (ref 0.0–0.2)

## 2019-07-30 LAB — RPR: RPR Ser Ql: NONREACTIVE

## 2019-07-30 NOTE — Lactation Note (Addendum)
This note was copied from a baby's chart. Lactation Consultation Note Baby 15 hrs old. Mom awake when LC entered rm. Mom stated she was in the middle of feeding the baby. Mom had baby swaddled and wasn't on the breast. Mom pulled baby into football position. Suggested mom un-swaddle baby. Mom did so. LC placed folded blanket under mom's hand/baby's head for support to keep cheeks to breast. FOB was in the bed sleeping beside of mom. He appeared to be annoyed by [facility] presents. Discussed w/mom newborn behavior, STS, I&O, milk storage, breast massage, supply and demand. LPI information sheet given d/t less than 6lbs. Reviewed. Mom hopes just to breast/bottle (BM). When discussing supplementing, encouraged mom to hand express colostrum and spoon feed baby. Explained normal not to get anything when pumping, important to pump for stimulation. Informed mom of Donor milk if needed. Mom shown how to use DEBP & how to disassemble, clean, & reassemble parts. Mom knows to pump q3h for 15-20 min. Mentioned to mom she could pump in the morning when she gets up, mom chose to pump now. Mom encouraged to waken baby for feedings if hasn't cued in 3 hrs. Mom encouraged to feed baby 8-12 times/24 hours and with feeding cues.  Mom has short shaft nipples. Shells given to evert more for deeper latch. Hand pump given for pre-pumping. Encouraged mom to call for assistance or questions. Encouraged mom to rest when baby rest. Mom stated baby is wanting to feed every hour. Discussed cluster feeding. Mom stated there is no resting around here. LC apologized  Stating since you were awake LC wanted to help you w/BF and see if there was anything you needed as well as important information since the baby was less than 6 lbs. Lactation brochure given.  Patient Name: Linda Curry [id number] Date: 07/30/2019 Reason for consult: Initial assessment;Primapara;Infant < 6lbs;Term   Maternal Data Has patient been taught Hand  Expression?: Yes Does the patient have breastfeeding experience prior to this delivery?: No  Feeding Feeding Type: Breast Milk with Formula added Nipple Type: Slow - flow  LATCH Score Latch: Repeated attempts needed to sustain latch, nipple held in mouth throughout feeding, stimulation needed to elicit sucking reflex.  Audible Swallowing: None  Type of Nipple: Everted at rest and after stimulation(short shaft)  Comfort (Breast/Nipple): Soft / non-tender  Hold (Positioning): No assistance needed to correctly position infant at breast.  LATCH Score: 7  Interventions Interventions: Breast feeding basics reviewed;Support pillows;Position options;Breast massage;Hand express;Pre-pump if needed;Shells;Breast compression;Adjust position;DEBP;Hand pump  Lactation Tools Discussed/Used Tools: Shells;Pump Shell Type: Inverted Breast pump type: Double-Electric Breast Pump;Manual WIC Program: Yes Pump Review: Setup, frequency, and cleaning;Milk Storage Initiated by:: Peri Jefferson RN IBCLC Date initiated:: 07/30/19   Consult Status Consult Status: Follow-up Date: 07/30/19 Follow-up type: In-patient    Daily Crate, Diamond Nickel 07/30/2019, 4:20 AM

## 2019-07-30 NOTE — Progress Notes (Signed)
Post Partum Day 1 Subjective: no complaints, up ad lib, voiding and tolerating PO  Objective: Blood pressure (!) 99/46, pulse 77, temperature 97.8 F (36.6 C), temperature source Oral, resp. rate 18, last menstrual period 10/26/2018, SpO2 98 %, unknown if currently breastfeeding.  Physical Exam:  General: alert, cooperative and no distress Lochia: appropriate Uterine Fundus: firm Incision: NA DVT Evaluation: No evidence of DVT seen on physical exam.  Recent Labs    07/29/19 1152 07/30/19 0521  HGB 11.4* 10.5*  HCT 34.6* 31.8*    Assessment/Plan: Plan for discharge tomorrow, Breastfeeding and Lactation consult  Routine pospartum care    LOS: 1 day   Linda Curry 07/30/2019, 1:40 PM

## 2019-07-31 MED ORDER — IBUPROFEN 600 MG PO TABS: 600.0000 mg | ORAL_TABLET | Freq: Four times a day (QID) | ORAL | 0 refills | Status: DC | PRN

## 2019-07-31 NOTE — Lactation Note (Signed)
This note was copied from a baby's chart. Lactation Consultation Note  Patient Name: Linda Curry [id number] Date: 07/31/2019   Baby 45 hours old and mother has been breastfeeding and now is supplementing with formula and breastmilk. Mother recently pumped 23 ml.  Mother has rusty pipe and is aware she can feed baby. Mother has 2 DEBP at home. Discussed pumping after every other breastfeeding session and giving volume back to baby. Reviewed LPI volume guidelines increasing per day of life and as baby desired. Feed on demand with cues.  Goal 8-12+ times per day after first 24 hrs.  Place baby STS if not cueing.  Reviewed engorgement care and monitoring voids/stools.      Maternal Data    Feeding Feeding Type: Bottle Fed - Formula Nipple Type: Slow - flow  LATCH Score                   Interventions    Lactation Tools Discussed/Used     Consult Status      Hardie Pulley 07/31/2019, 12:04 PM

## 2019-07-31 NOTE — Discharge Summary (Signed)
OB Discharge Summary     Patient Name: Linda Curry DOB: 09/11/1995 MRN: [id number]  Date of admission: 07/29/2019 Delivering MD: Thurnell Lose   Date of discharge: 07/31/2019  Admitting diagnosis: Normal labor [O80, Z37.9] Intrauterine pregnancy: [redacted]w[redacted]d     Secondary diagnosis:  Active Problems:   Normal labor  Additional problems: None     Discharge diagnosis: Term Pregnancy Delivered                                                                                                Post partum procedures:None  Augmentation: None  Complications: None  Hospital course:  Onset of Labor With Vaginal Delivery     24 y.o. yo G2P1011 at [redacted]w[redacted]d was admitted in Active Labor on 07/29/2019. Patient had an uncomplicated labor course as follows:  Membrane Rupture Time/Date: 12:47 PM ,07/29/2019   Intrapartum Procedures: Episiotomy: None [1]                                         Lacerations:  1st degree [2]  Patient had a delivery of a Viable infant. 07/29/2019  Information for the patient's newborn:  Leronda, Lewers [[id number]]  Delivery Method: Vaginal, Spontaneous(Filed from Delivery Summary)     Pateint had an uncomplicated postpartum course.  She is ambulating, tolerating a regular diet, passing flatus, and urinating well. Patient is discharged home in stable condition on 07/31/19.   Physical exam  Vitals:   07/30/19 1349 07/30/19 1937 07/30/19 2153 07/31/19 0621  BP: 100/64  (!) 108/54 (!) 100/56  Pulse: 74  88 82  Resp: 16   16  Temp: 99.2 F (37.3 C)  98.1 F (36.7 C) 98.6 F (37 C)  TempSrc: Oral  Oral Oral  SpO2: 97%  100% 100%  Weight:  64.1 kg    Height:  4' 11.5" (1.511 m)     General: alert, cooperative and no distress Lochia: appropriate Uterine Fundus: firm Incision: N/A DVT Evaluation: No evidence of DVT seen on physical exam. Labs: Lab Results  Component Value Date   WBC 14.4 (H) 07/30/2019   HGB 10.5 (L) 07/30/2019   HCT 31.8 (L)  07/30/2019   MCV 79.7 (L) 07/30/2019   PLT 227 07/30/2019   No flowsheet data found.  Discharge instruction: per After Visit Summary and "Baby and Me Booklet".  After visit meds:  Allergies as of 07/31/2019      Reactions   Banana Anaphylaxis, Hives, Itching, Swelling   Throat swells      Medication List    STOP taking these medications   cyclobenzaprine 10 MG tablet Commonly known as: FLEXERIL     TAKE these medications   acetaminophen 325 MG tablet Commonly known as: Tylenol Take 2 tablets (650 mg total) by mouth every 4 (four) hours as needed.   B-6 PO Take 1 tablet by mouth at bedtime.   diphenhydrAMINE 25 MG tablet Commonly known as: BENADRYL Take 1 tablet (25 mg total) by mouth every 6 (six)  hours as needed for up to 1 day.   EPINEPHrine 0.3 mg/0.3 mL Soaj injection Commonly known as: EPI-PEN Inject 0.3 mLs (0.3 mg total) into the muscle as needed for anaphylaxis.   famotidine 20 MG tablet Commonly known as: PEPCID Take 1 tablet (20 mg total) by mouth 2 (two) times daily for 3 days.   ibuprofen 600 MG tablet Commonly known as: ADVIL Take 1 tablet (600 mg total) by mouth every 6 (six) hours as needed.   prenatal multivitamin Tabs tablet Take 1 tablet by mouth daily at 12 noon.       Diet: routine diet  Activity: Advance as tolerated. Pelvic rest for 6 weeks.   Outpatient follow up:6 weeks Follow up Appt:No future appointments. Follow up Visit:No follow-ups on file.  Postpartum contraception: Abstinence  Newborn Data: Live born female  Birth Weight: 5 lb 10.1 oz (2555 g) APGAR: 9, 9  Newborn Delivery   Birth date/time: 07/29/2019 12:51:00 Delivery type: Vaginal, Spontaneous      Baby Feeding: Bottle and Breast Disposition:home with mother   07/31/2019 Gerald Leitz, MD

## 2019-08-04 ENCOUNTER — Other Ambulatory Visit (HOSPITAL_COMMUNITY): Payer: BC Managed Care – PPO

## 2019-08-04 DIAGNOSIS — Z00110 Health examination for newborn under 8 days old: Secondary | ICD-10-CM | POA: Diagnosis not present

## 2019-08-06 ENCOUNTER — Inpatient Hospital Stay (HOSPITAL_COMMUNITY): Payer: BC Managed Care – PPO

## 2019-08-06 ENCOUNTER — Inpatient Hospital Stay (HOSPITAL_COMMUNITY)
Admission: AD | Admit: 2019-08-06 | Payer: BC Managed Care – PPO | Source: Home / Self Care | Admitting: Obstetrics and Gynecology

## 2019-09-09 DIAGNOSIS — Z8742 Personal history of other diseases of the female genital tract: Secondary | ICD-10-CM | POA: Diagnosis not present

## 2019-09-09 DIAGNOSIS — F53 Postpartum depression: Secondary | ICD-10-CM | POA: Diagnosis not present

## 2019-09-22 DIAGNOSIS — Z308 Encounter for other contraceptive management: Secondary | ICD-10-CM | POA: Diagnosis not present

## 2019-09-22 DIAGNOSIS — Z3202 Encounter for pregnancy test, result negative: Secondary | ICD-10-CM | POA: Diagnosis not present

## 2019-10-20 DIAGNOSIS — Z01419 Encounter for gynecological examination (general) (routine) without abnormal findings: Secondary | ICD-10-CM | POA: Diagnosis not present

## 2019-11-10 DIAGNOSIS — K602 Anal fissure, unspecified: Secondary | ICD-10-CM | POA: Diagnosis not present

## 2019-11-10 DIAGNOSIS — R102 Pelvic and perineal pain: Secondary | ICD-10-CM | POA: Diagnosis not present

## 2019-11-10 DIAGNOSIS — K625 Hemorrhage of anus and rectum: Secondary | ICD-10-CM | POA: Diagnosis not present

## 2019-12-08 DIAGNOSIS — Z20828 Contact with and (suspected) exposure to other viral communicable diseases: Secondary | ICD-10-CM | POA: Diagnosis not present

## 2019-12-09 DIAGNOSIS — Z309 Encounter for contraceptive management, unspecified: Secondary | ICD-10-CM | POA: Diagnosis not present

## 2019-12-12 DIAGNOSIS — J029 Acute pharyngitis, unspecified: Secondary | ICD-10-CM | POA: Diagnosis not present

## 2019-12-12 DIAGNOSIS — Z3046 Encounter for surveillance of implantable subdermal contraceptive: Secondary | ICD-10-CM | POA: Diagnosis not present

## 2020-10-18 DIAGNOSIS — Z01419 Encounter for gynecological examination (general) (routine) without abnormal findings: Secondary | ICD-10-CM | POA: Diagnosis not present

## 2020-10-18 DIAGNOSIS — Z113 Encounter for screening for infections with a predominantly sexual mode of transmission: Secondary | ICD-10-CM | POA: Diagnosis not present

## 2020-11-10 DIAGNOSIS — Z23 Encounter for immunization: Secondary | ICD-10-CM | POA: Diagnosis not present

## 2020-11-10 DIAGNOSIS — Z3202 Encounter for pregnancy test, result negative: Secondary | ICD-10-CM | POA: Diagnosis not present

## 2020-11-10 DIAGNOSIS — R87610 Atypical squamous cells of undetermined significance on cytologic smear of cervix (ASC-US): Secondary | ICD-10-CM | POA: Diagnosis not present

## 2020-11-10 DIAGNOSIS — R87810 Cervical high risk human papillomavirus (HPV) DNA test positive: Secondary | ICD-10-CM | POA: Diagnosis not present

## 2021-01-17 DIAGNOSIS — R350 Frequency of micturition: Secondary | ICD-10-CM | POA: Diagnosis not present

## 2021-01-17 DIAGNOSIS — R1032 Left lower quadrant pain: Secondary | ICD-10-CM | POA: Diagnosis not present

## 2021-02-08 DIAGNOSIS — J069 Acute upper respiratory infection, unspecified: Secondary | ICD-10-CM | POA: Diagnosis not present

## 2021-02-08 DIAGNOSIS — J029 Acute pharyngitis, unspecified: Secondary | ICD-10-CM | POA: Diagnosis not present

## 2021-03-25 ENCOUNTER — Encounter (HOSPITAL_COMMUNITY): Payer: Self-pay | Admitting: Obstetrics and Gynecology

## 2021-03-25 ENCOUNTER — Other Ambulatory Visit: Payer: Self-pay

## 2021-03-25 ENCOUNTER — Inpatient Hospital Stay (HOSPITAL_COMMUNITY)
Admission: AD | Admit: 2021-03-25 | Discharge: 2021-03-25 | Disposition: A | Discharge status: Home / Self Care | Payer: BC Managed Care – PPO | Attending: Obstetrics and Gynecology | Admitting: Obstetrics and Gynecology

## 2021-03-25 ENCOUNTER — Inpatient Hospital Stay (HOSPITAL_COMMUNITY): Payer: BC Managed Care – PPO

## 2021-03-25 DIAGNOSIS — Z333 Pregnant state, gestational carrier: Secondary | ICD-10-CM | POA: Insufficient documentation

## 2021-03-25 DIAGNOSIS — O26851 Spotting complicating pregnancy, first trimester: Secondary | ICD-10-CM | POA: Insufficient documentation

## 2021-03-25 DIAGNOSIS — O209 Hemorrhage in early pregnancy, unspecified: Secondary | ICD-10-CM

## 2021-03-25 DIAGNOSIS — O3680X0 Pregnancy with inconclusive fetal viability, not applicable or unspecified: Secondary | ICD-10-CM | POA: Insufficient documentation

## 2021-03-25 DIAGNOSIS — Z3A00 Weeks of gestation of pregnancy not specified: Secondary | ICD-10-CM | POA: Diagnosis not present

## 2021-03-25 DIAGNOSIS — Z3A01 Less than 8 weeks gestation of pregnancy: Secondary | ICD-10-CM | POA: Diagnosis not present

## 2021-03-25 LAB — CBC
HCT: 35 % — ABNORMAL LOW (ref 36.0–46.0)
Hemoglobin: 11.5 g/dL — ABNORMAL LOW (ref 12.0–15.0)
MCH: 25.2 pg — ABNORMAL LOW (ref 26.0–34.0)
MCHC: 32.9 g/dL (ref 30.0–36.0)
MCV: 76.6 fL — ABNORMAL LOW (ref 80.0–100.0)
Platelets: 363 10*3/uL (ref 150–400)
RBC: 4.57 MIL/uL (ref 3.87–5.11)
RDW: 14.2 % (ref 11.5–15.5)
WBC: 9.7 10*3/uL (ref 4.0–10.5)
nRBC: 0 % (ref 0.0–0.2)

## 2021-03-25 LAB — WET PREP, GENITAL
Clue Cells Wet Prep HPF POC: NONE SEEN
Sperm: NONE SEEN
Trich, Wet Prep: NONE SEEN
WBC, Wet Prep HPF POC: <10 (ref <10)
Yeast Wet Prep HPF POC: NONE SEEN

## 2021-03-25 LAB — URINALYSIS, ROUTINE W REFLEX MICROSCOPIC
Bacteria, UA: NONE SEEN
Bilirubin Urine: NEGATIVE
Glucose, UA: NEGATIVE mg/dL
Ketones, ur: 5 mg/dL — AB
Leukocytes,Ua: NEGATIVE
Nitrite: NEGATIVE
Protein, ur: NEGATIVE mg/dL
Specific Gravity, Urine: 1.028 (ref 1.005–1.030)
pH: 5 (ref 5.0–8.0)

## 2021-03-25 LAB — HCG, QUANTITATIVE, PREGNANCY: hCG, Beta Chain, Quant, S: 4855 m[IU]/mL — ABNORMAL HIGH (ref <5)

## 2021-03-25 LAB — POCT PREGNANCY, URINE: Preg Test, Ur: POSITIVE — AB

## 2021-03-25 IMAGING — US US OB < 14 WEEKS - US OB TV
1 series · 15 of 28 positions shown · non-contrast
Comparison: None.

CLINICAL DATA: First trimester bleeding

EXAM:
OBSTETRIC <14 WK US AND TRANSVAGINAL OB US
TECHNIQUE: Both transabdominal and transvaginal ultrasound examinations were
performed for complete evaluation of the gestation as well as the
maternal uterus, adnexal regions, and pelvic cul-de-sac.
Transvaginal technique was performed to assess early pregnancy.

[Series 1: us ob < 14 weeks - us ob tv · 15 of 62 slices shown]
[im 1/62]
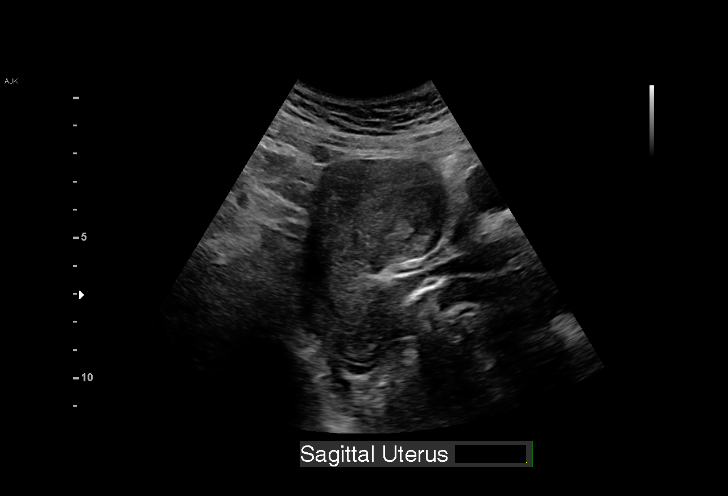
[im 5/62]
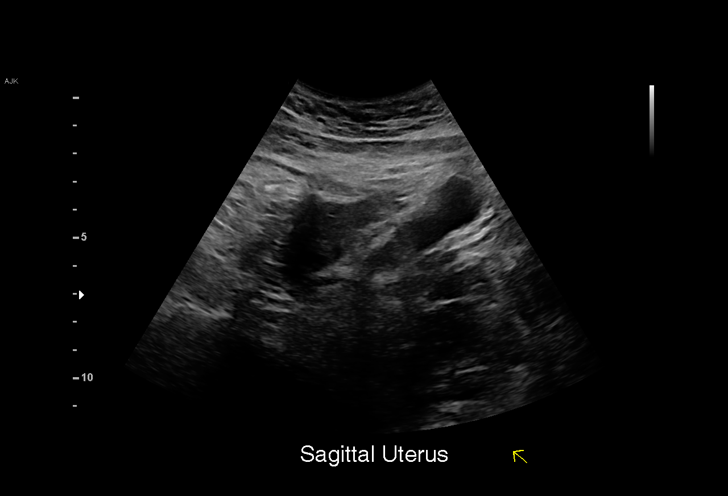
[im 10/62]
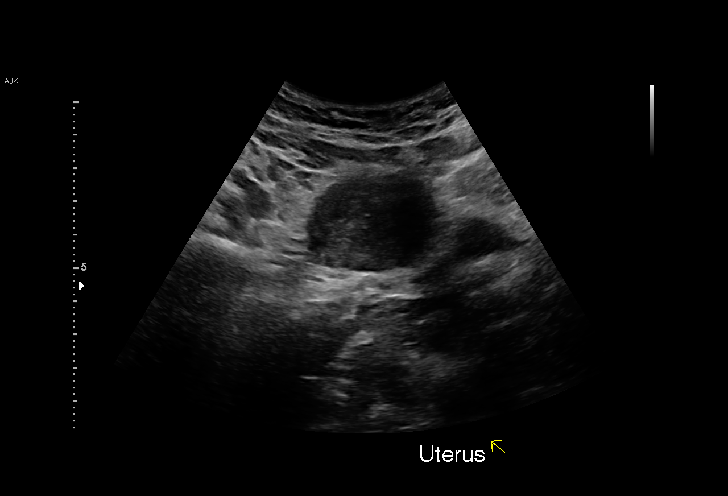
[im 14/62]
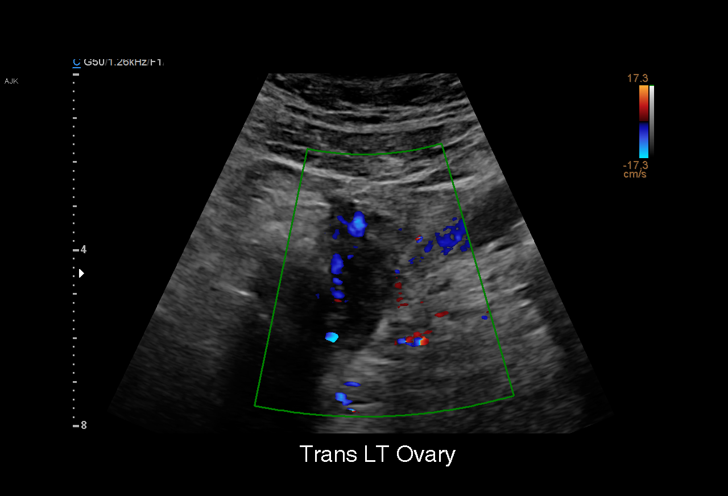
[im 19/62]
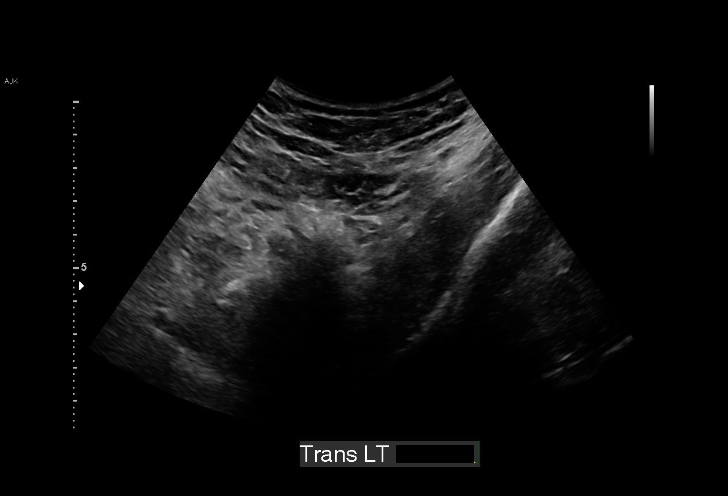
[im 23/62]
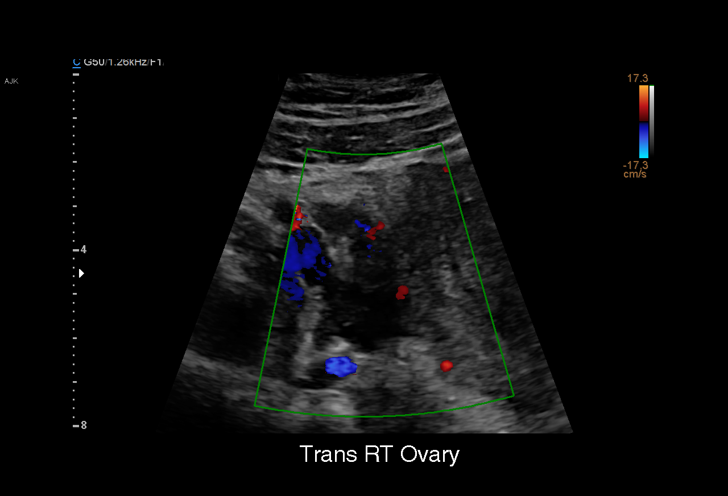
[im 28/62]
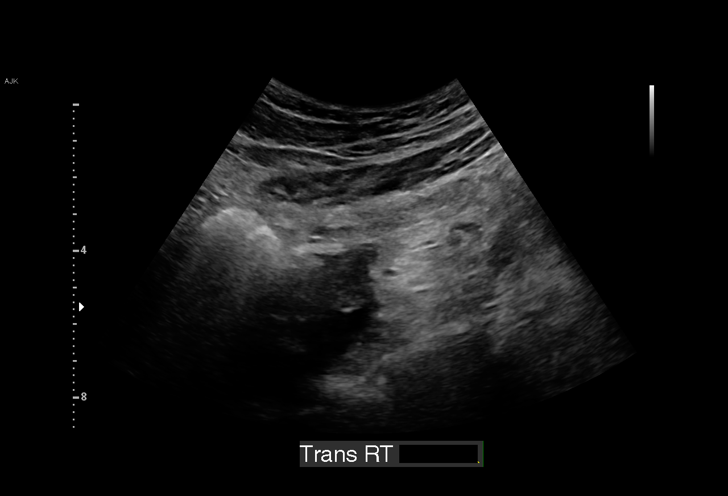
[im 32/62]
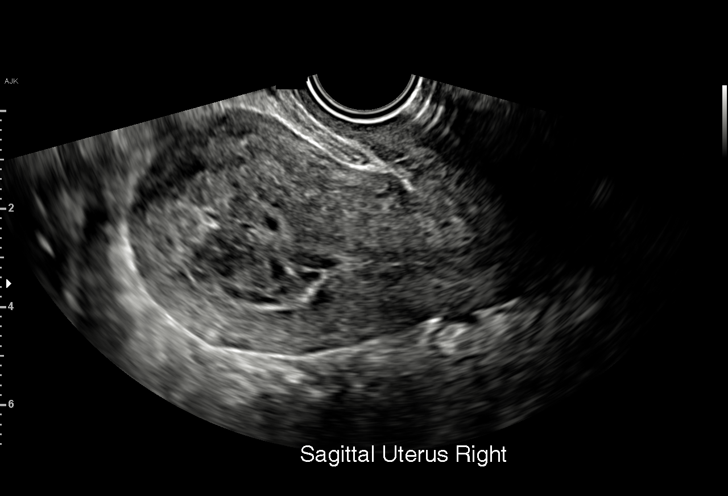
[im 34/62]
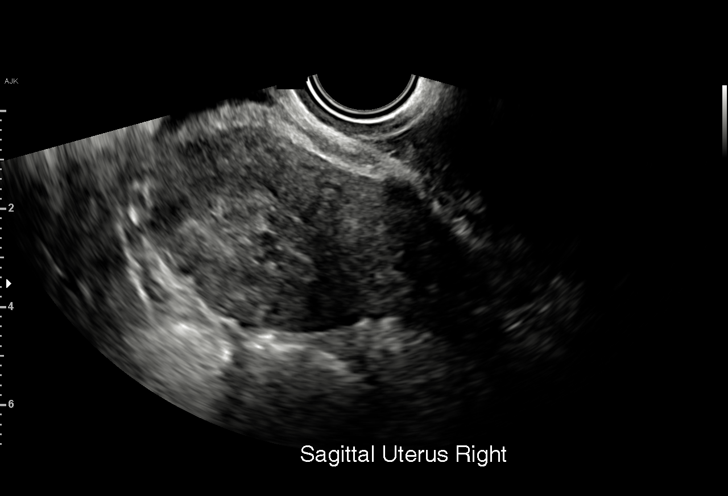
[im 39/62]
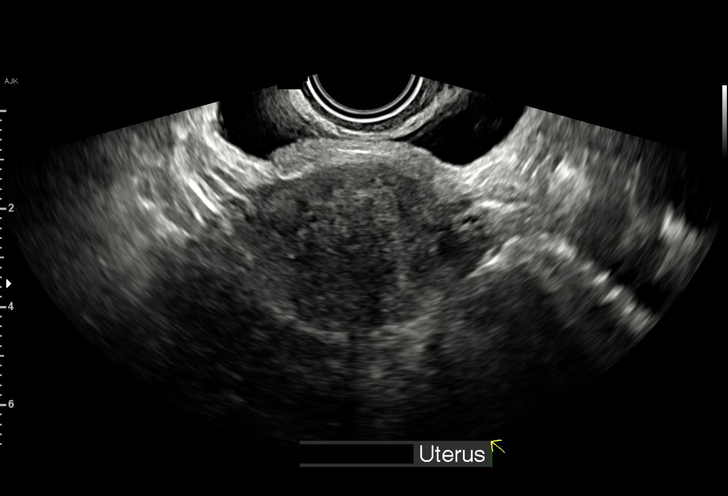
[im 43/62]
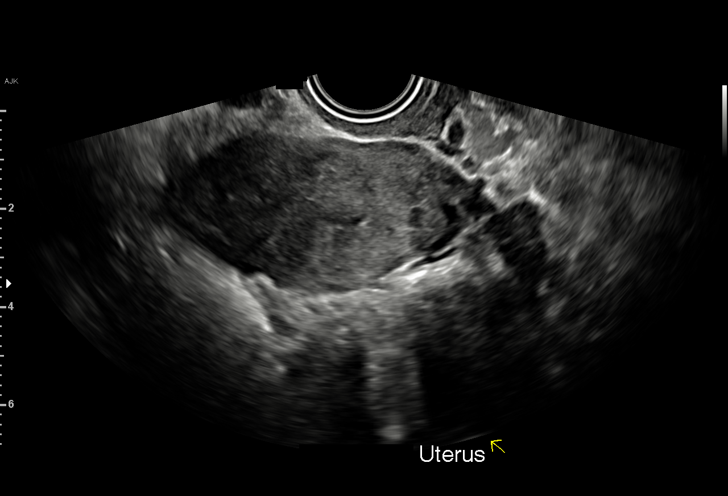
[im 48/62]
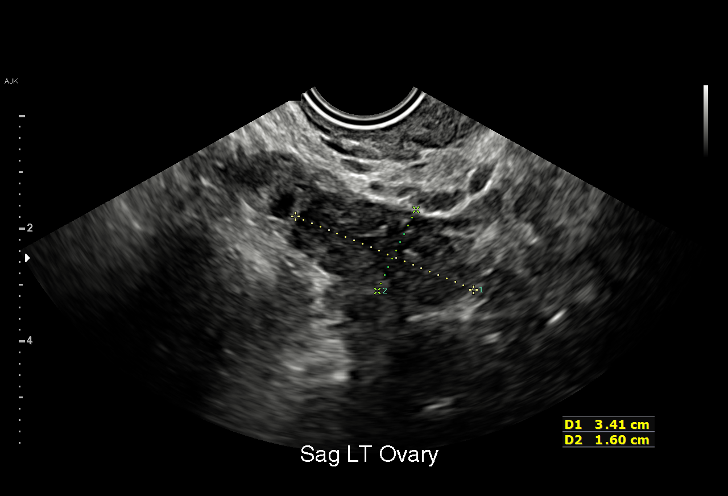
[im 52/62]
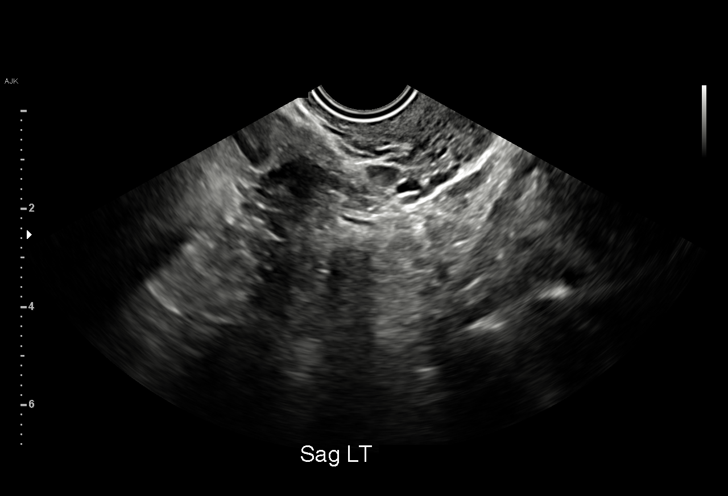
[im 57/62]
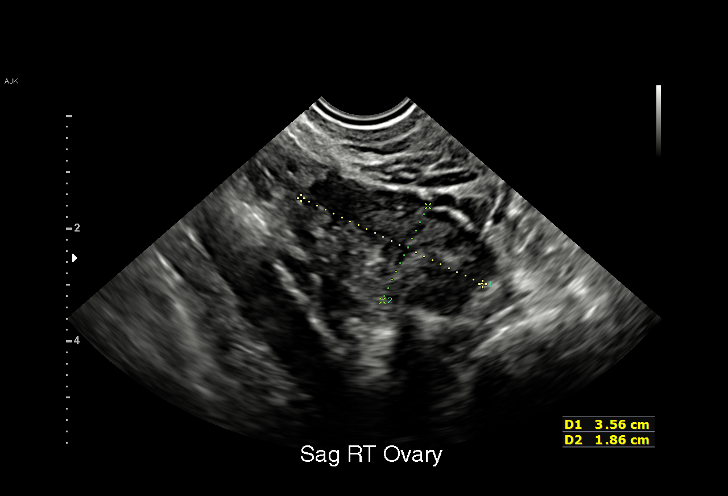
[im 62/62]
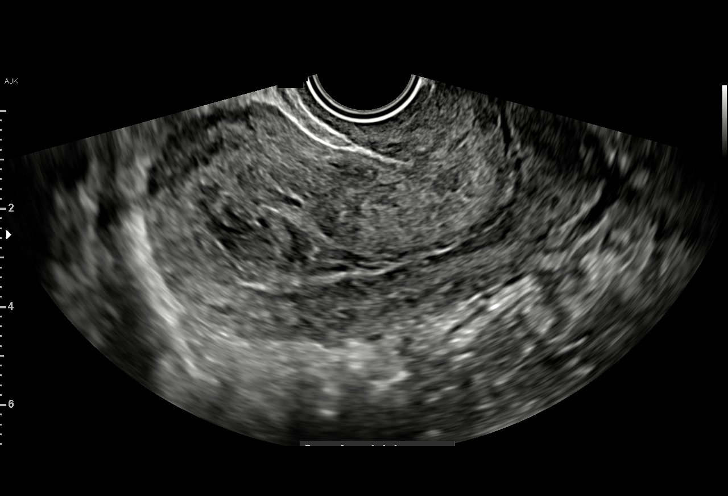

[15 of 28 positions shown; findings below may reference images not displayed]

FINDINGS: Intrauterine gestational sac: Absent

Yolk sac:  Absent

Embryo:  Absent

Cardiac Activity: Absent

Heart Rate: Not bpm

Subchorionic hemorrhage:  None visualized.

Maternal uterus/adnexae: Thickening (2.1 cm stripe thickness) and
heterogeneity along the endometrium, possibly blood products. No
recognizable gestational sac. Left ovary 2.7 by 2.2 by 1.8 cm and
the right ovary measures 2.1 by 3.0 by 1.6 cm. No adnexal mass is
identified.
IMPRESSION: 1. Thickening and heterogeneity along the endometrium possibly
reflecting blood products. No recognized gestational sac.
Sonographic appearance favors failed pregnancy or lack of pregnancy
(correlate with quantitative beta HCG trend). Early pregnancy seems
unlikely given the heterogeneity of the endometrial contents. No
adnexal mass to further suggest ectopic pregnancy.

## 2021-03-25 NOTE — MAU Note (Addendum)
Linda Curry is a 25 y.o. at [redacted]w[redacted]d here in MAU reporting: is on endometrium medication inserts. This morning when she went to the bathroom she noticed some blood in her pants. Was having some mild cramping but states no pain at this time. Is wearing a pad but states she is not saturating a pad. States has been going through infertility treatments and has had 2 hcg levels drawn and they rose normally, has not had a u/s yet. First beta was 150 on Nov 11 and then on Nov 14 it was 650.  LMP: 02/08/2021  Onset of complaint: today  Pain score: 0/10  Vitals:   03/25/21 1423  BP: (!) 111/55  Pulse: 84  Resp: 16  Temp: 98.5 F (36.9 C)  SpO2: 99%     Lab orders placed from triage: UA, UPT

## 2021-03-25 NOTE — MAU Provider Note (Signed)
History     CSN: [id number]  Arrival date and time: 03/25/21 1404   Event Date/Time   First Provider Initiated Contact with Patient 03/25/21 1437      Chief Complaint  Patient presents with   Vaginal Bleeding   HPI Linda Curry is a 25 y.o. G3P1011 at [redacted]w[redacted]d who presents with spotting. She states she noticed some bleeding when she went to the bathroom this morning. The bleeding has since slowed down to spotting. She denies any pain at this time. She is a gestational carrier and her clinic advised her to come in and be seen. She reports HCG levels last week that rose appropriately and she is scheduled for an ultrasound next week.  OB History     Gravida  3   Para  1   Term  1   Preterm      AB  1   Living  1      SAB  1   IAB      Ectopic      Multiple  0   Live Births  1           Past Medical History:  Diagnosis Date   Infection    UTI    Past Surgical History:  Procedure Laterality Date   NO PAST SURGERIES      Family History  Problem Relation Age of Onset   Asthma Mother    Hypertension Mother    Healthy Father    Cancer Maternal Grandmother        pancreatic   Diabetes Maternal Grandmother    Cancer Maternal Grandfather     Social History   Tobacco Use   Smoking status: Never   Smokeless tobacco: Never  Vaping Use   Vaping Use: Never used  Substance Use Topics   Alcohol use: No   Drug use: No    Allergies:  Allergies  Allergen Reactions   Banana Anaphylaxis, Hives, Itching and Swelling    Throat swells    Medications Prior to Admission  Medication Sig Dispense Refill Last Dose   diphenhydrAMINE (BENADRYL) 25 MG tablet Take 1 tablet (25 mg total) by mouth every 6 (six) hours as needed for up to 1 day. 4 tablet 0    EPINEPHrine 0.3 mg/0.3 mL IJ SOAJ injection Inject 0.3 mLs (0.3 mg total) into the muscle as needed for anaphylaxis. 2 each 2    famotidine (PEPCID) 20 MG tablet Take 1 tablet (20 mg total) by mouth 2  (two) times daily for 3 days. 6 tablet 0    ibuprofen (ADVIL) 600 MG tablet Take 1 tablet (600 mg total) by mouth every 6 (six) hours as needed. 30 tablet 0    Prenatal Vit-Fe Fumarate-FA (PRENATAL MULTIVITAMIN) TABS tablet Take 1 tablet by mouth daily at 12 noon.      Pyridoxine HCl (B-6 PO) Take 1 tablet by mouth at bedtime.       Review of Systems  Constitutional: Negative.  Negative for fatigue and fever.  HENT: Negative.    Respiratory: Negative.  Negative for shortness of breath.   Cardiovascular: Negative.  Negative for chest pain.  Gastrointestinal: Negative.  Negative for abdominal pain, constipation, diarrhea, nausea and vomiting.  Genitourinary:  Positive for vaginal bleeding. Negative for dysuria and vaginal discharge.  Neurological: Negative.  Negative for dizziness and headaches.  Physical Exam   Blood pressure (!) 111/55, pulse 84, temperature 98.5 F (36.9 C), temperature source Oral, resp. rate 16, height  5' (1.524 m), weight 62.4 kg, last menstrual period 02/08/2021, SpO2 99 %, unknown if currently breastfeeding.  Physical Exam Vitals and nursing note reviewed.  Constitutional:      General: She is not in acute distress.    Appearance: She is well-developed.  HENT:     Head: Normocephalic.  Eyes:     Pupils: Pupils are equal, round, and reactive to light.  Cardiovascular:     Rate and Rhythm: Normal rate and regular rhythm.     Heart sounds: Normal heart sounds.  Pulmonary:     Effort: Pulmonary effort is normal. No respiratory distress.     Breath sounds: Normal breath sounds.  Abdominal:     General: Bowel sounds are normal. There is no distension.     Palpations: Abdomen is soft.     Tenderness: There is no abdominal tenderness.  Skin:    General: Skin is warm and dry.  Neurological:     Mental Status: She is alert and oriented to person, place, and time.  Psychiatric:        Mood and Affect: Mood normal.        Behavior: Behavior normal.         Thought Content: Thought content normal.        Judgment: Judgment normal.    MAU Course  Procedures Results for orders placed or performed during the hospital encounter of 03/25/21 (from the past 24 hour(s))  Pregnancy, urine POC     Status: Abnormal   Collection Time: 03/25/21  2:12 PM  Result Value Ref Range   Preg Test, Ur POSITIVE (A) NEGATIVE  Urinalysis, Routine w reflex microscopic Urine, Clean Catch     Status: Abnormal   Collection Time: 03/25/21  2:17 PM  Result Value Ref Range   Color, Urine AMBER (A) YELLOW   APPearance HAZY (A) CLEAR   Specific Gravity, Urine 1.028 1.005 - 1.030   pH 5.0 5.0 - 8.0   Glucose, UA NEGATIVE NEGATIVE mg/dL   Hgb urine dipstick LARGE (A) NEGATIVE   Bilirubin Urine NEGATIVE NEGATIVE   Ketones, ur 5 (A) NEGATIVE mg/dL   Protein, ur NEGATIVE NEGATIVE mg/dL   Nitrite NEGATIVE NEGATIVE   Leukocytes,Ua NEGATIVE NEGATIVE   RBC / HPF 6-10 0 - 5 RBC/hpf   WBC, UA 0-5 0 - 5 WBC/hpf   Bacteria, UA NONE SEEN NONE SEEN   Squamous Epithelial / LPF 11-20 0 - 5   Mucus PRESENT   CBC     Status: Abnormal   Collection Time: 03/25/21  2:43 PM  Result Value Ref Range   WBC 9.7 4.0 - 10.5 K/uL   RBC 4.57 3.87 - 5.11 MIL/uL   Hemoglobin 11.5 (L) 12.0 - 15.0 g/dL   HCT 16.1 (L) 09.6 - 04.5 %   MCV 76.6 (L) 80.0 - 100.0 fL   MCH 25.2 (L) 26.0 - 34.0 pg   MCHC 32.9 30.0 - 36.0 g/dL   RDW 40.9 81.1 - 91.4 %   Platelets 363 150 - 400 K/uL   nRBC 0.0 0.0 - 0.2 %  hCG, quantitative, pregnancy     Status: Abnormal   Collection Time: 03/25/21  2:43 PM  Result Value Ref Range   hCG, Beta Chain, Quant, S 4,855 (H) <5 mIU/mL  Wet prep, genital     Status: None   Collection Time: 03/25/21  2:55 PM  Result Value Ref Range   Yeast Wet Prep HPF POC NONE SEEN NONE SEEN   Trich,  Wet Prep NONE SEEN NONE SEEN   Clue Cells Wet Prep HPF POC NONE SEEN NONE SEEN   WBC, Wet Prep HPF POC <10 <10   Sperm NONE SEEN     US OB LESS THAN 14 WEEKS WITH OB  TRANSVAGINAL  Result Date: 03/25/2021 CLINICAL DATA:  First trimester bleeding EXAM: OBSTETRIC <14 WK [place] AND TRANSVAGINAL OB US TECHNIQUE: Both transabdominal and transvaginal ultrasound examinations were performed for complete evaluation of the gestation as well as the maternal uterus, adnexal regions, and pelvic cul-de-sac. Transvaginal technique was performed to assess early pregnancy. COMPARISON:  None. FINDINGS: Intrauterine gestational sac: Absent Yolk sac:  Absent Embryo:  Absent Cardiac Activity: Absent Heart Rate: Not bpm Subchorionic hemorrhage:  None visualized. Maternal uterus/adnexae: Thickening (2.1 cm stripe thickness) and heterogeneity along the endometrium, possibly blood products. No recognizable gestational sac. Left ovary 2.7 by 2.2 by 1.8 cm and the right ovary measures 2.1 by 3.0 by 1.6 cm. No adnexal mass is identified. IMPRESSION: 1. Thickening and heterogeneity along the endometrium possibly reflecting blood products. No recognized gestational sac. Sonographic appearance favors failed pregnancy or lack of pregnancy (correlate with quantitative beta HCG trend). Early pregnancy seems unlikely given the heterogeneity of the endometrial contents. No adnexal mass to further suggest ectopic pregnancy. Electronically Signed   By: Gaylyn Rong M.D.   On: 03/25/2021 15:35     MDM UA, UPT CBC, HCG ABO/Rh-  Wet prep and gc/chlamydia US OB Comp Less 14 weeks with Transvaginal  Consulted with Dr. Vergie Living- will have patient come back for 48 hour HCG  Discussed with client the diagnosis of pregnancy of unknown anatomic location.  Three possibilities of outcome are: a healthy pregnancy that is too early to see a yolk sac to confirm the pregnancy is in the uterus, a pregnancy that is not healthy and has not developed and will not develop, and an ectopic pregnancy that is in the abdomen that cannot be identified at this time.  And ectopic pregnancy can be a life threatening situation as a  pregnancy needs to be in the uterus which is a muscle and can stretch to accommodate the growth of a pregnancy.  Other structures in the pelvis and abdomen as not muscular and do not stretch with the growth of a pregnancy.  Worst case scenario is that a structure ruptures with a growing pregnancy not in the uterus and and internal hemorrhage can be a life threatening situation.  We need to follow the progression of this pregnancy carefully.  We need to check another serum pregnancy hormone level to determine if the levels are rising appropriately  and to determine the next steps that are needed for you. Patient's questions were answered.  Assessment and Plan   1. Pregnancy of unknown anatomic location   2. Vaginal bleeding affecting early pregnancy   3. [redacted] weeks gestation of pregnancy   4. Surrogate pregnancy    -Discharge home in stable condition -Vaginal bleeding and pain precautions discussed -Patient advised to follow-up with [facility] on Sunday for repeat HCG -Patient may return to MAU as needed or if her condition were to change or worsen   Rolm Bookbinder CNM 03/25/2021, 2:37 PM

## 2021-03-27 ENCOUNTER — Other Ambulatory Visit (HOSPITAL_COMMUNITY)
Admission: AD | Admit: 2021-03-27 | Payer: BC Managed Care – PPO | Source: Home / Self Care | Admitting: Obstetrics & Gynecology

## 2021-03-27 ENCOUNTER — Other Ambulatory Visit (HOSPITAL_COMMUNITY): Payer: BC Managed Care – PPO

## 2021-03-27 ENCOUNTER — Inpatient Hospital Stay (HOSPITAL_COMMUNITY)
Admission: AD | Admit: 2021-03-27 | Discharge: 2021-03-27 | Disposition: A | Discharge status: Home / Self Care | Payer: BC Managed Care – PPO | Attending: Obstetrics and Gynecology | Admitting: Obstetrics and Gynecology

## 2021-03-27 ENCOUNTER — Other Ambulatory Visit: Payer: Self-pay

## 2021-03-27 DIAGNOSIS — O039 Complete or unspecified spontaneous abortion without complication: Secondary | ICD-10-CM | POA: Diagnosis not present

## 2021-03-27 DIAGNOSIS — Z3A01 Less than 8 weeks gestation of pregnancy: Secondary | ICD-10-CM | POA: Insufficient documentation

## 2021-03-27 LAB — HCG, QUANTITATIVE, PREGNANCY: hCG, Beta Chain, Quant, S: 951 m[IU]/mL — ABNORMAL HIGH (ref <5)

## 2021-03-27 NOTE — MAU Provider Note (Signed)
History   Chief Complaint:  repeat hcg   Linda Curry is  25 y.o. G3P1011 Patient's last menstrual period was 02/08/2021.Marland Kitchen Patient is here for follow up of quantitative HCG and ongoing surveillance of pregnancy status. She is [redacted]w[redacted]d weeks gestation  by date of conception at fertility clinic- patient is gestational carrier.  Since her last visit, the patient is without new complaint. The patient reports bleeding as  none now.  She denies any pain.  General ROS:  negative  Her previous Quantitative HCG values are:   Latest Reference Range & Units 03/25/21 14:43  HCG, Beta Chain, Quant, S <5 mIU/mL 4,855 (H)  (H): Data is abnormally high   Physical Exam   Blood pressure 112/65, pulse 95, temperature 98.4 F (36.9 C), temperature source Oral, resp. rate 15, height 4' 11.5" (1.511 m), weight 62.4 kg, last menstrual period 02/08/2021, SpO2 99 %, unknown if currently breastfeeding.  Physical Exam Vitals and nursing note reviewed.  Constitutional:      General: She is not in acute distress.    Appearance: She is well-developed.  HENT:     Head: Normocephalic.  Eyes:     Pupils: Pupils are equal, round, and reactive to light.  Cardiovascular:     Rate and Rhythm: Normal rate.  Pulmonary:     Effort: Pulmonary effort is normal. No respiratory distress.  Abdominal:     Tenderness: There is no abdominal tenderness.  Musculoskeletal:        General: Normal range of motion.     Cervical back: Normal range of motion.  Skin:    General: Skin is warm and dry.  Neurological:     Mental Status: She is alert and oriented to person, place, and time.  Psychiatric:        Behavior: Behavior normal.        Thought Content: Thought content normal.        Judgment: Judgment normal.   Labs: Results for orders placed or performed during the hospital encounter of 03/27/21 (from the past 24 hour(s))  hCG, quantitative, pregnancy   Collection Time: 03/27/21 10:03 AM  Result Value Ref Range    hCG, Beta Chain, Quant, S 951 (H) <5 mIU/mL   Assessment:   1. Miscarriage   2. [redacted] weeks gestation of pregnancy    -Reviewed results with patient and expressed sympathy for diagnosis. Patient will follow up with surrogacy agency and fertility office for future follow up   Plan: -Discharge home in stable condition -Vaginal bleeding and pain precautions discussed -Patient advised to follow-up with OB as scheduled for prenatal care -Patient may return to MAU as needed or if her condition were to change or worsen  Rolm Bookbinder, CNM 03/27/2021, 11:40 AM

## 2021-03-27 NOTE — MAU Note (Signed)
...  Linda Curry is a 25 y.o. at [redacted]w[redacted]d here in MAU reporting: here for repeat hCG. Denies any vaginal bleeding or pain.   Lab in triage retrieving patients blood work.  Vitals:   03/27/21 1004  BP: 112/65  Pulse: 95  Resp: 15  Temp: 98.4 F (36.9 C)  SpO2: 99%

## 2021-03-28 DIAGNOSIS — O200 Threatened abortion: Secondary | ICD-10-CM | POA: Diagnosis not present

## 2021-03-28 DIAGNOSIS — O209 Hemorrhage in early pregnancy, unspecified: Secondary | ICD-10-CM | POA: Diagnosis not present

## 2021-03-28 DIAGNOSIS — O021 Missed abortion: Secondary | ICD-10-CM | POA: Diagnosis not present

## 2021-03-28 DIAGNOSIS — O0281 Inappropriate change in quantitative human chorionic gonadotropin (hCG) in early pregnancy: Secondary | ICD-10-CM | POA: Diagnosis not present

## 2021-03-28 DIAGNOSIS — Z3201 Encounter for pregnancy test, result positive: Secondary | ICD-10-CM | POA: Diagnosis not present

## 2021-03-28 LAB — GC/CHLAMYDIA PROBE AMP (~~LOC~~) NOT AT ARMC
Chlamydia: NEGATIVE
Comment: NEGATIVE
Comment: NORMAL
Neisseria Gonorrhea: NEGATIVE

## 2021-08-08 DIAGNOSIS — R2232 Localized swelling, mass and lump, left upper limb: Secondary | ICD-10-CM | POA: Diagnosis not present

## 2021-08-09 ENCOUNTER — Other Ambulatory Visit: Payer: Self-pay | Admitting: Physician Assistant

## 2021-08-09 DIAGNOSIS — R2232 Localized swelling, mass and lump, left upper limb: Secondary | ICD-10-CM

## 2021-08-11 ENCOUNTER — Ambulatory Visit
Admission: RE | Admit: 2021-08-11 | Discharge: 2021-08-11 | Disposition: A | Discharge status: Home / Self Care | Payer: BC Managed Care – PPO | Source: Ambulatory Visit | Attending: Physician Assistant | Admitting: Physician Assistant

## 2021-08-11 ENCOUNTER — Other Ambulatory Visit: Payer: Self-pay | Admitting: Physician Assistant

## 2021-08-11 DIAGNOSIS — R2232 Localized swelling, mass and lump, left upper limb: Secondary | ICD-10-CM

## 2021-08-11 DIAGNOSIS — N6332 Unspecified lump in axillary tail of the left breast: Secondary | ICD-10-CM | POA: Diagnosis not present

## 2021-08-30 ENCOUNTER — Emergency Department (HOSPITAL_COMMUNITY): Payer: BC Managed Care – PPO

## 2021-08-30 ENCOUNTER — Encounter (HOSPITAL_COMMUNITY): Payer: Self-pay

## 2021-08-30 ENCOUNTER — Emergency Department (HOSPITAL_COMMUNITY)
Admission: EM | Admit: 2021-08-30 | Discharge: 2021-08-30 | Disposition: A | Discharge status: Home / Self Care | Payer: BC Managed Care – PPO | Attending: Emergency Medicine | Admitting: Emergency Medicine

## 2021-08-30 DIAGNOSIS — E876 Hypokalemia: Secondary | ICD-10-CM | POA: Insufficient documentation

## 2021-08-30 DIAGNOSIS — Y990 Civilian activity done for income or pay: Secondary | ICD-10-CM | POA: Diagnosis not present

## 2021-08-30 DIAGNOSIS — T754XXA Electrocution, initial encounter: Secondary | ICD-10-CM | POA: Diagnosis not present

## 2021-08-30 DIAGNOSIS — R200 Anesthesia of skin: Secondary | ICD-10-CM | POA: Insufficient documentation

## 2021-08-30 DIAGNOSIS — R000 Tachycardia, unspecified: Secondary | ICD-10-CM | POA: Diagnosis not present

## 2021-08-30 LAB — CBC WITH DIFFERENTIAL/PLATELET
Abs Immature Granulocytes: 0.01 10*3/uL (ref 0.00–0.07)
Basophils Absolute: 0 10*3/uL (ref 0.0–0.1)
Basophils Relative: 1 %
Eosinophils Absolute: 0.4 10*3/uL (ref 0.0–0.5)
Eosinophils Relative: 5 %
HCT: 40.9 % (ref 36.0–46.0)
Hemoglobin: 12.9 g/dL (ref 12.0–15.0)
Immature Granulocytes: 0 %
Lymphocytes Relative: 25 %
Lymphs Abs: 2.2 10*3/uL (ref 0.7–4.0)
MCH: 24.8 pg — ABNORMAL LOW (ref 26.0–34.0)
MCHC: 31.5 g/dL (ref 30.0–36.0)
MCV: 78.5 fL — ABNORMAL LOW (ref 80.0–100.0)
Monocytes Absolute: 0.7 10*3/uL (ref 0.1–1.0)
Monocytes Relative: 8 %
Neutro Abs: 5.5 10*3/uL (ref 1.7–7.7)
Neutrophils Relative %: 61 %
Platelets: 326 10*3/uL (ref 150–400)
RBC: 5.21 MIL/uL — ABNORMAL HIGH (ref 3.87–5.11)
RDW: 14.1 % (ref 11.5–15.5)
WBC: 8.9 10*3/uL (ref 4.0–10.5)
nRBC: 0 % (ref 0.0–0.2)

## 2021-08-30 LAB — BASIC METABOLIC PANEL
Anion gap: 9 (ref 5–15)
BUN: 10 mg/dL (ref 6–20)
CO2: 23 mmol/L (ref 22–32)
Calcium: 9.6 mg/dL (ref 8.9–10.3)
Chloride: 104 mmol/L (ref 98–111)
Creatinine, Ser: 0.51 mg/dL (ref 0.44–1.00)
GFR, Estimated: >60 mL/min (ref >60)
Glucose, Bld: 87 mg/dL (ref 70–99)
Potassium: 3.4 mmol/L — ABNORMAL LOW (ref 3.5–5.1)
Sodium: 136 mmol/L (ref 135–145)

## 2021-08-30 LAB — LACTIC ACID, PLASMA: Lactic Acid, Venous: 1.3 mmol/L (ref 0.5–1.9)

## 2021-08-30 LAB — CK: Total CK: 119 U/L (ref 38–234)

## 2021-08-30 LAB — TROPONIN I (HIGH SENSITIVITY): Troponin I (High Sensitivity): <2 ng/L (ref <18)

## 2021-08-30 IMAGING — MR MR CERVICAL SPINE W/O CM
6 series · 36 of 48 positions shown · non-contrast
Comparison: None Available.

CLINICAL DATA: Acute cervical myelopathy

EXAM:
MRI CERVICAL SPINE WITHOUT CONTRAST
TECHNIQUE: Multiplanar, multisequence MR imaging of the cervical spine was
performed. No intravenous contrast was administered.

[Series 5: T1 · sagittal · 3.0mm · 0.69mm/px · 6 of 15 slices shown (1 of 2)]
[im 1/15]
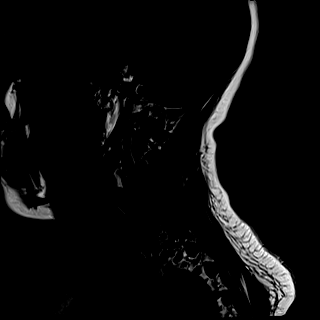
[im 3/15]
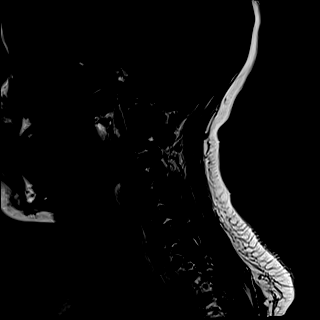
[im 6/15]
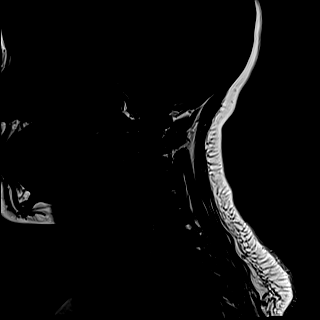
[im 9/15]
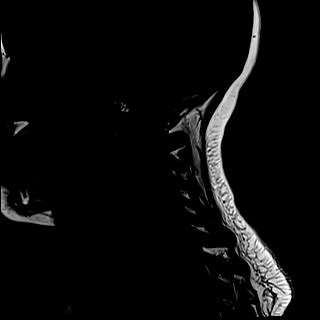
[im 12/15]
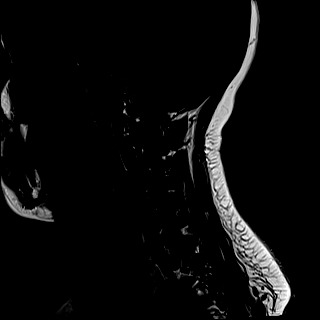
[im 15/15]
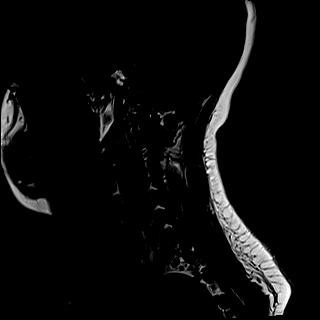

[Series 6: T2 · sagittal · 3.0mm · 0.69mm/px · 6 of 15 slices shown (1 of 2)]
[im 1/15]
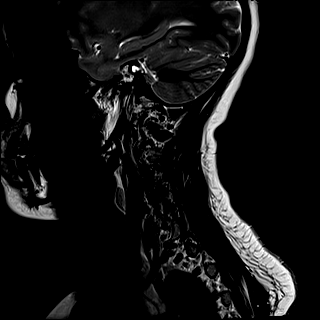
[im 3/15]
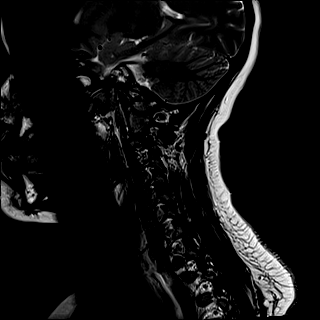
[im 6/15]
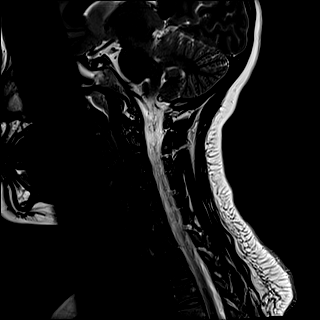
[im 9/15]
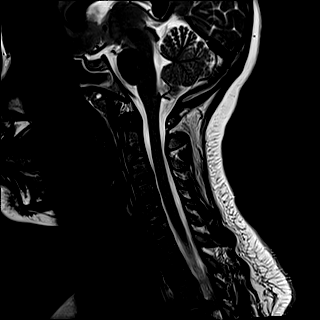
[im 12/15]
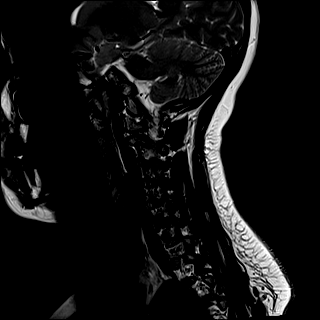
[im 15/15]
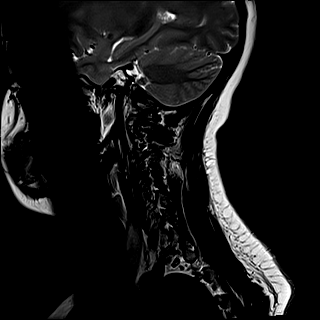

[Series 7: STIR · sagittal · 3.0mm · 0.86mm/px · 6 of 15 slices shown]
[im 1/15]
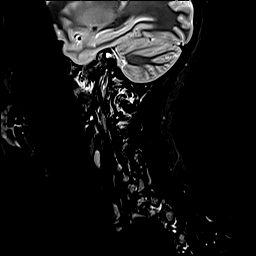
[im 3/15]
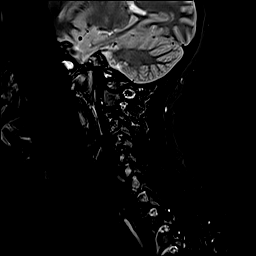
[im 6/15]
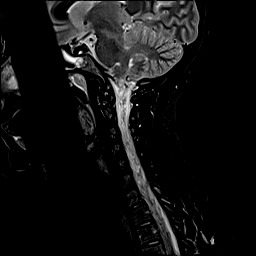
[im 9/15]
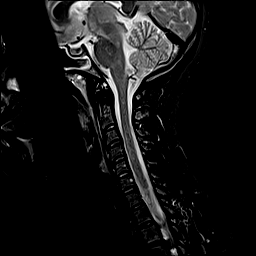
[im 12/15]
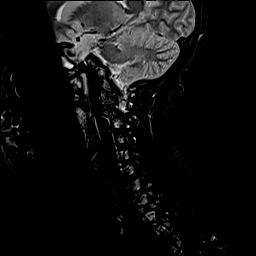
[im 15/15]
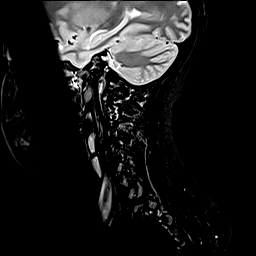

[Series 8: T2 · axial · 3.0mm · 0.70mm/px · z∈[-165,-80]mm · 8 of 27 slices shown (2 of 2)]
[im 1/27]
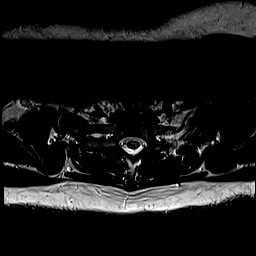
[im 3/27]
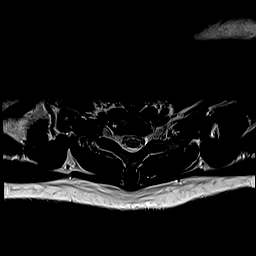
[im 9/27]
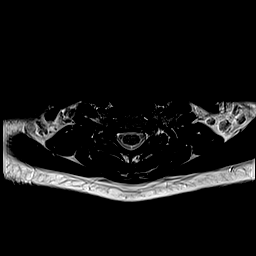
[im 12/27]
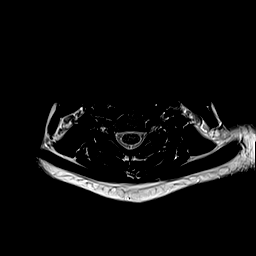
[im 15/27]
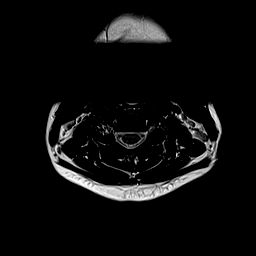
[im 18/27]
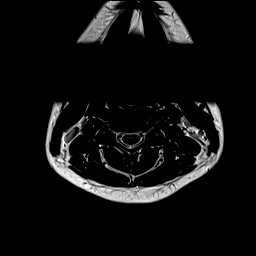
[im 24/27]
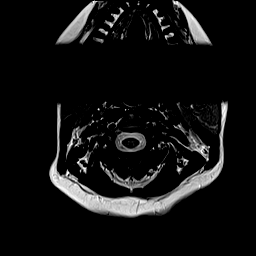
[im 27/27]
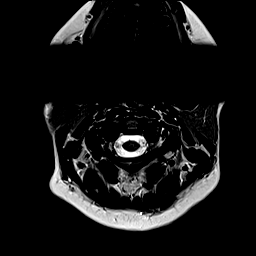

[Series 9: GRE · axial · 3.0mm · 0.35mm/px · z∈[-165,-158]mm · 2 of 27 slices shown]
[im 1/27]
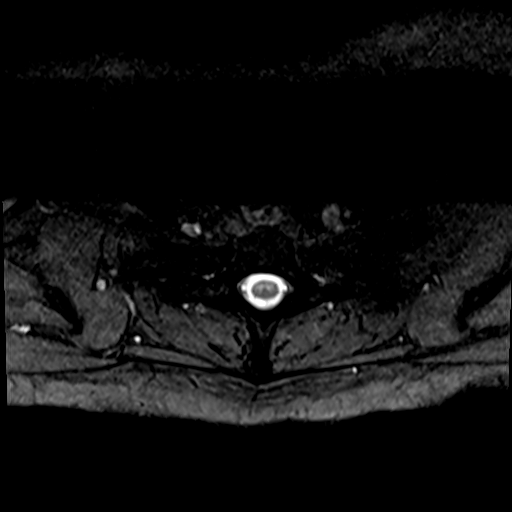
[im 3/27]
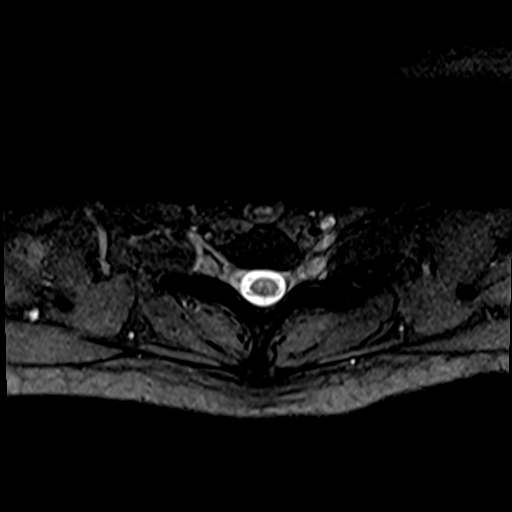

[Series 10: T1 · axial · 3.0mm · 0.35mm/px · z∈[-165,-80]mm · 8 of 27 slices shown (2 of 2)]
[im 1/27]
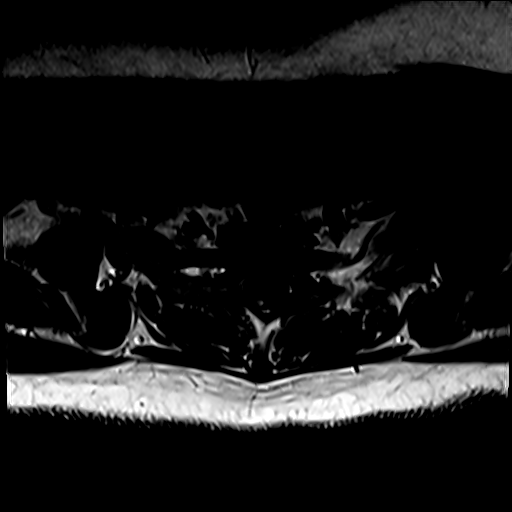
[im 3/27]
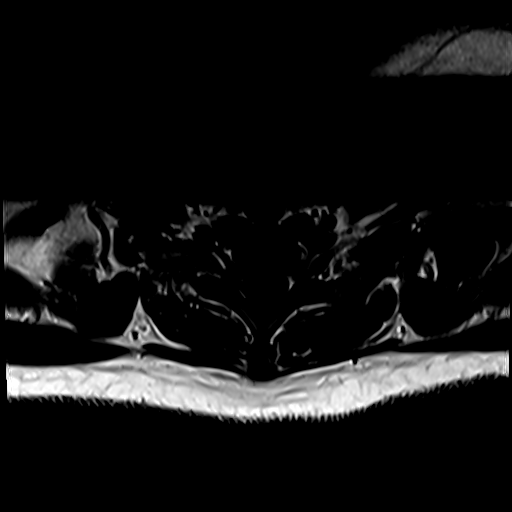
[im 9/27]
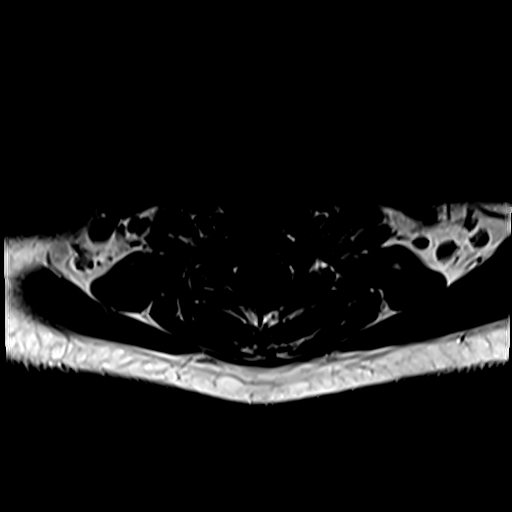
[im 12/27]
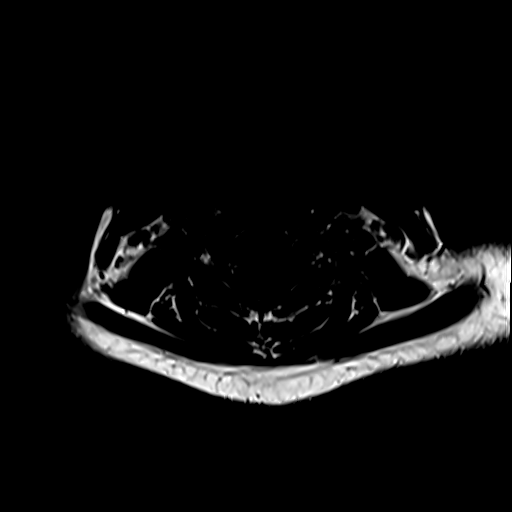
[im 15/27]
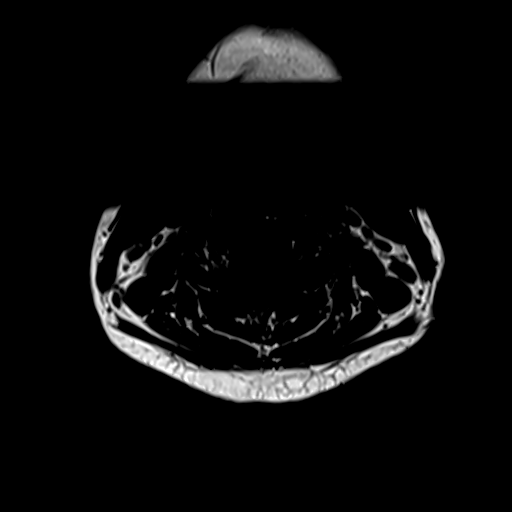
[im 18/27]
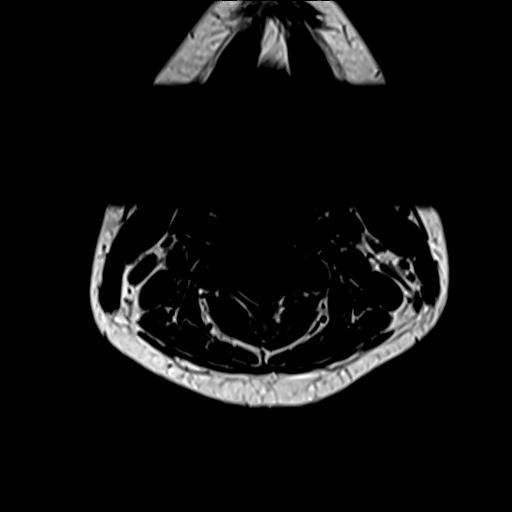
[im 24/27]
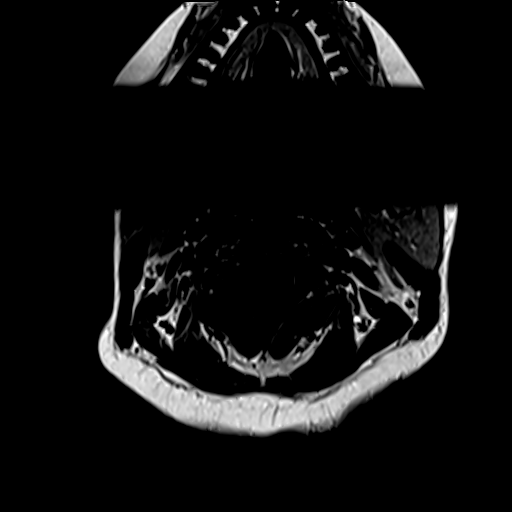
[im 27/27]
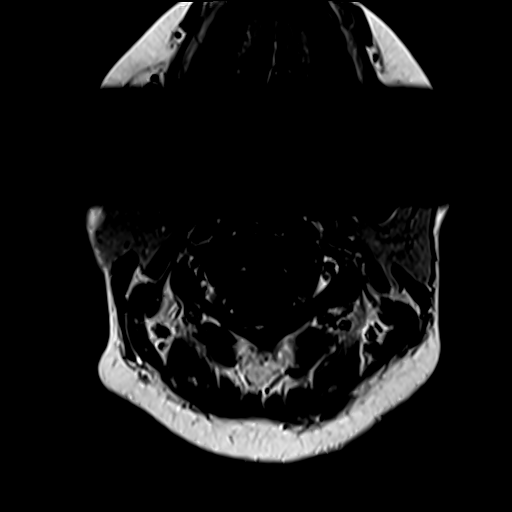

[36 of 48 positions shown; findings below may reference images not displayed]

FINDINGS: Alignment: Normal

Vertebrae: Normal bone marrow.  Negative for fracture or mass

Cord: Normal signal and morphology

Posterior Fossa, vertebral arteries, paraspinal tissues: Negative

Disc levels:

Normal disc spaces. No significant degenerative change. Negative for
disc protrusion or stenosis.
IMPRESSION: Normal MRI cervical spine.

## 2021-08-30 IMAGING — MR MR HEAD W/O CM
10 series · 48 of 48 positions shown · non-contrast
Comparison: None Available.

CLINICAL DATA: Right arm numbness

EXAM:
MRI HEAD WITHOUT CONTRAST
TECHNIQUE: Multiplanar, multiecho pulse sequences of the brain and surrounding
structures were obtained without intravenous contrast.

[Series 5: DWI · axial · 3.0mm · 1.36mm/px · z∈[-68,+80]mm · 9 of 104 slices shown (1 of 2)]
[im 1/104]
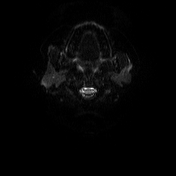
[im 13/104]
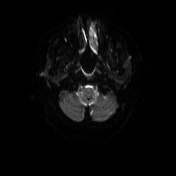
[im 26/104]
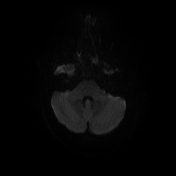
[im 39/104]
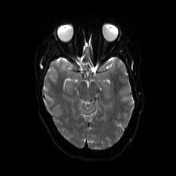
[im 52/104]
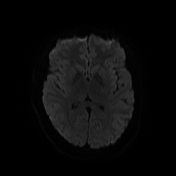
[im 65/104]
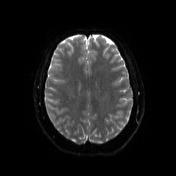
[im 78/104]
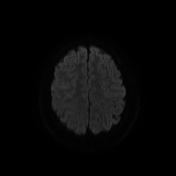
[im 91/104]
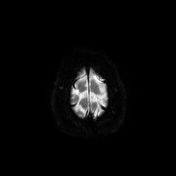
[im 104/104]
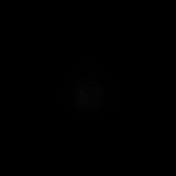

[Series 6: DWI · axial · 3.0mm · 1.36mm/px · z∈[-68,+80]mm · 4 of 52 slices shown (2 of 2)]
[im 1/52]
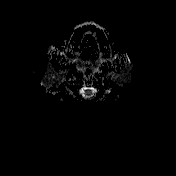
[im 18/52]
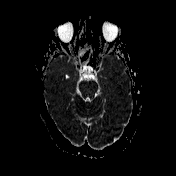
[im 35/52]
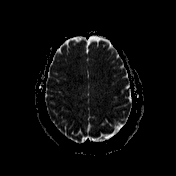
[im 52/52]
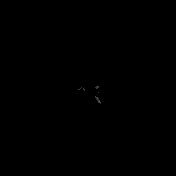

[Series 7: T1 · sagittal · 5.0mm · 0.75mm/px · 2 of 24 slices shown (1 of 2)]
[im 1/24]
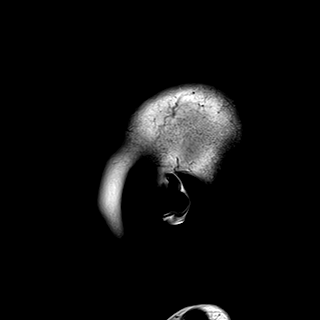
[im 24/24]
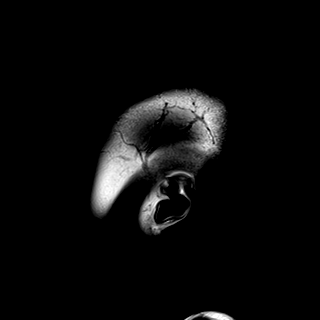

[Series 8: T2 · axial · 5.0mm · 0.62mm/px · z∈[-67,+91]mm · 2 of 26 slices shown (1 of 2)]
[im 1/26]
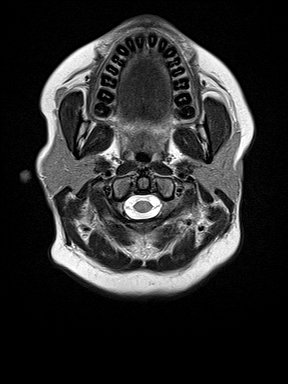
[im 26/26]
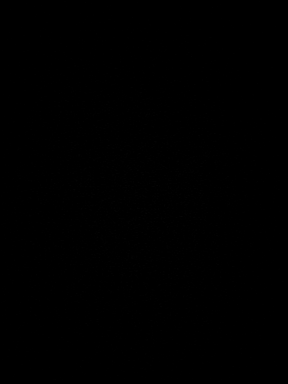

[Series 9: swi_images · axial · 3.0mm · 0.75mm/px · z∈[-68,+92]mm · 4 of 56 slices shown]
[im 1/56]
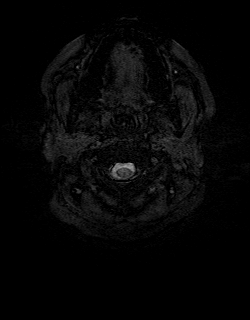
[im 19/56]
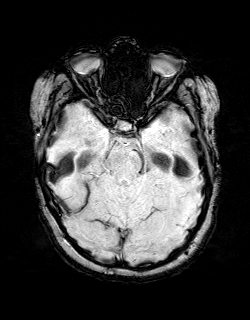
[im 37/56]
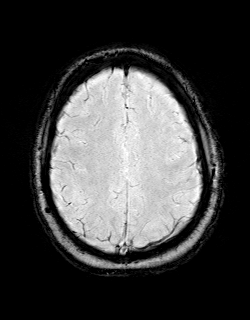
[im 56/56]
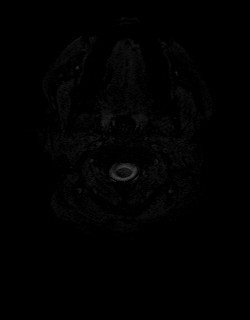

[Series 11: FLAIR · axial · 3.0mm · 0.75mm/px · z∈[-62,+87]mm · 4 of 52 slices shown]
[im 1/52]
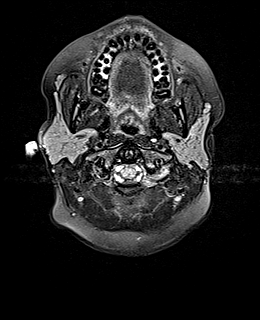
[im 18/52]
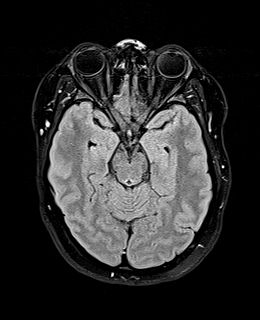
[im 35/52]
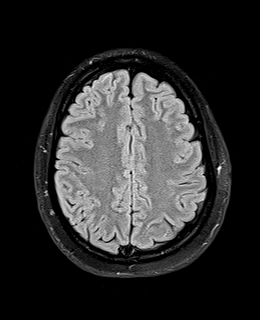
[im 52/52]
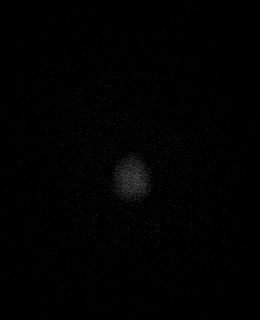

[Series 12: T1 · axial · 1.0mm · 0.94mm/px · z∈[-74,+97]mm · 14 of 176 slices shown (2 of 2)]
[im 1/176]
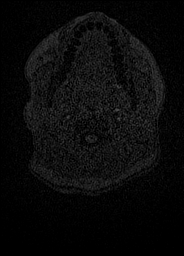
[im 14/176]
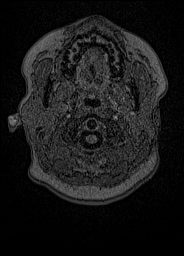
[im 27/176]
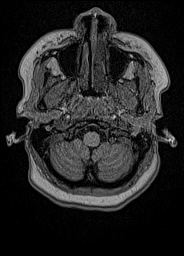
[im 41/176]
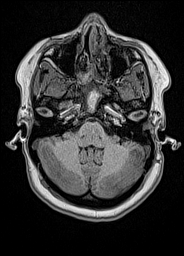
[im 54/176]
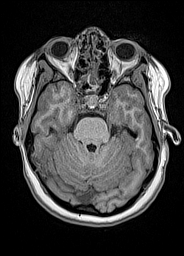
[im 68/176]
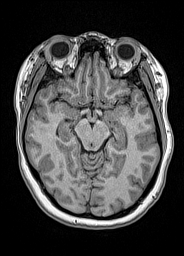
[im 81/176]
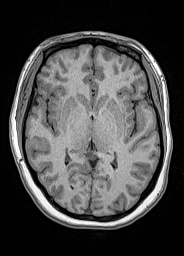
[im 95/176]
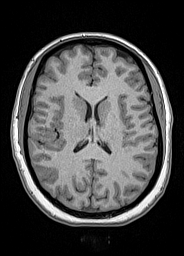
[im 108/176]
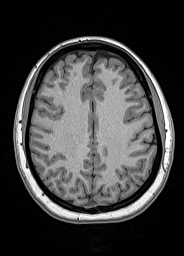
[im 122/176]
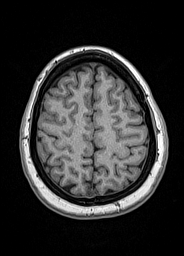
[im 135/176]
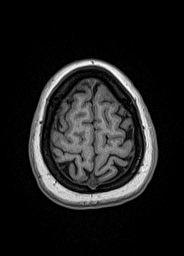
[im 149/176]
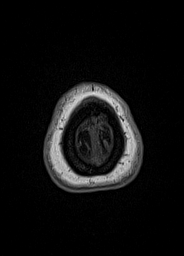
[im 162/176]
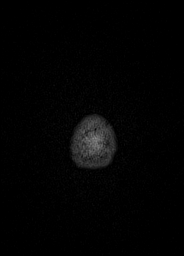
[im 176/176]
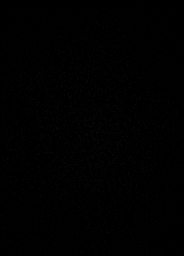

[Series 13: cor dwi_tracew · coronal · 5.0mm · 1.53mm/px · 4 of 56 slices shown]
[im 1/56]
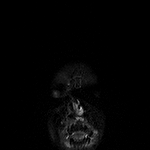
[im 19/56]
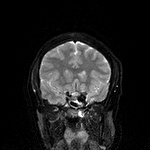
[im 37/56]
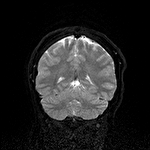
[im 56/56]
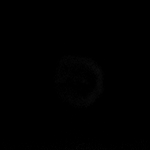

[Series 14: cor dwi_adc · coronal · 5.0mm · 1.53mm/px · 2 of 28 slices shown]
[im 1/28]
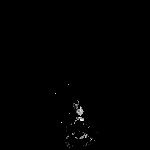
[im 28/28]
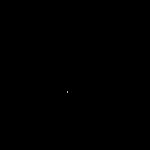

[Series 15: T2 · coronal · 5.0mm · 0.57mm/px · 3 of 34 slices shown (2 of 2)]
[im 1/34]
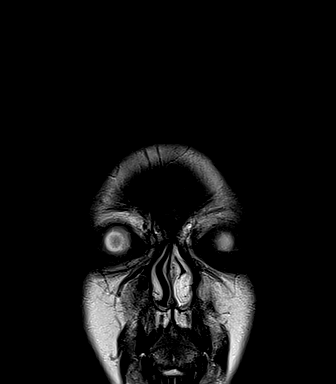
[im 17/34]
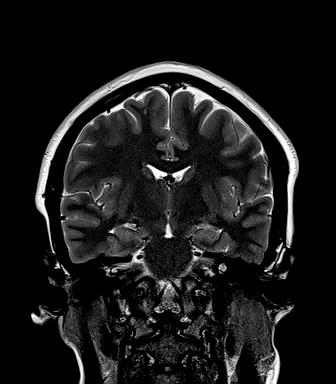
[im 34/34]
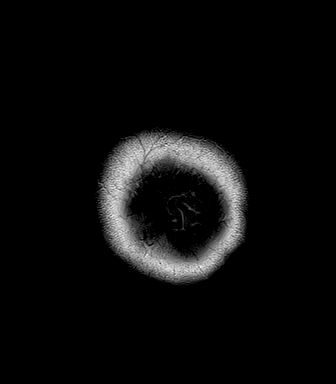

[48 of 48 positions shown; findings below may reference images not displayed]

FINDINGS: Brain: No acute infarct, mass effect or extra-axial collection. No
acute or chronic hemorrhage. Normal white matter signal, parenchymal
volume and CSF spaces. The midline structures are normal.

Vascular: Major flow voids are preserved.

Skull and upper cervical spine: Normal calvarium and skull base.
Visualized upper cervical spine and soft tissues are normal.

Sinuses/Orbits:Mild paranasal sinus mucosal thickening. Normal
orbits.
IMPRESSION: Normal brain MRI.

## 2021-08-30 NOTE — Discharge Instructions (Signed)
You have been seen and discharged from the emergency department.  Your blood work was normal.  The neuroimaging of your head and cervical spine were unremarkable.  Your symptoms are most likely from the electric shock, should resolve.  Follow-up with your primary provider for further evaluation and further care. Take home medications as prescribed. If you have any worsening symptoms or further concerns for your health please return to an emergency department for further evaluation. ?

## 2021-08-30 NOTE — ED Provider Notes (Signed)
Patient signed out to me by previous provider. Please refer to their note for full HPI.  Briefly this is a 26 year old female who sustained a shock while unplugging a cord who is now complaining of right arm, hand and leg heaviness and numbness.  Blood work and EKG have been unremarkable from an electric shock standpoint.  Neurology was consulted and an MRI of the brain and cervical spine was recommended. ?Physical Exam  ?BP 127/74   Pulse 95   Resp 19   LMP 07/30/2021   SpO2 100%   Breastfeeding Unknown  ? ?Physical Exam ?Vitals and nursing note reviewed.  ?Constitutional:   ?   Appearance: Normal appearance.  ?HENT:  ?   Head: Normocephalic.  ?   Mouth/Throat:  ?   Mouth: Mucous membranes are moist.  ?Cardiovascular:  ?   Rate and Rhythm: Normal rate.  ?Pulmonary:  ?   Effort: Pulmonary effort is normal. No respiratory distress.  ?Abdominal:  ?   Palpations: Abdomen is soft.  ?   Tenderness: There is no abdominal tenderness.  ?Skin: ?   General: Skin is warm.  ?Neurological:  ?   Mental Status: She is alert and oriented to person, place, and time. Mental status is at baseline.  ?   Comments: Strength appears improved on my exam, still subjective change in sensation compared to the left side but no focal deficit  ?Psychiatric:     ?   Mood and Affect: Mood normal.  ? ? ?Procedures  ?Procedures ? ?ED Course / MDM  ?  ?Medical Decision Making ?Amount and/or Complexity of Data Reviewed ?Labs: ordered. ?Radiology: ordered. ? ? ?MRI of the brain and cervical spine is unremarkable.  Symptoms have improved.  Plan for outpatient follow-up.  Patient and mom understand the results and agree with the outpatient plan.  Patient at this time appears safe and stable for discharge and close outpatient follow up. Discharge plan and strict return to ED precautions discussed, patient verbalizes understanding and agreement. ? ? ? ? ?  ?Rozelle Logan, DO ?08/30/21 2241 ? ?

## 2021-08-30 NOTE — ED Notes (Signed)
Pt walked to the bathroom with assistance.

## 2021-08-30 NOTE — ED Notes (Addendum)
Pt was able to bear weight on leg but right side is very shaky. She does think it is improving. Still significant drift with left lower extremity and mild one with left upper extremity.  ?

## 2021-08-30 NOTE — ED Provider Notes (Addendum)
?[facility]-EMERGENCY DEPT ?Provider Note ? ? ?CSN: [id number] ?Arrival date & time: 08/30/21  1346 ? ?  ? ?History ? ?Chief Complaint  ?Patient presents with  ? Electric Shock  ? ? ?Linda Curry is a 26 y.o. female. ? ?HPI ? ?26 year old female who presents to the emergency department after an electric shock.  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  No loss of consciousness.  No chest pain or shortness of breath, palpitations or syncope.  She states that since the incident, her right arm and hand and right leg have felt numb and heavy. ? ?Home Medications ?Prior to Admission medications   ?Medication Sig Start Date End Date Taking? Authorizing Provider  ?diphenhydrAMINE (BENADRYL) 25 MG tablet Take 1 tablet (25 mg total) by mouth every 6 (six) hours as needed for up to 1 day. 12/24/18 12/25/18  Joy, Shawn C, PA-C  ?EPINEPHrine 0.3 mg/0.3 mL IJ SOAJ injection Inject 0.3 mLs (0.3 mg total) into the muscle as needed for anaphylaxis. 12/24/18   Joy, Shawn C, PA-C  ?famotidine (PEPCID) 20 MG tablet Take 1 tablet (20 mg total) by mouth 2 (two) times daily for 3 days. 12/24/18 12/27/18  Anselm Pancoast, PA-C  ?Prenatal Vit-Fe Fumarate-FA (PRENATAL MULTIVITAMIN) TABS tablet Take 1 tablet by mouth daily at 12 noon.    [provider]  ?Pyridoxine HCl (B-6 PO) Take 1 tablet by mouth at bedtime.    [provider]  ?   ? ?Allergies    ?Banana   ? ?Review of Systems   ?Review of Systems  ?Neurological:  Positive for weakness and numbness.  ?All other systems reviewed and are negative. ? ?Physical Exam ?Updated Vital Signs ?BP 118/72   Pulse 86   Resp 19   LMP 07/30/2021   SpO2 99%   Breastfeeding Unknown  ?Physical Exam ?Vitals and nursing note reviewed.  ?Constitutional:   ?   General: She is not in acute distress. ?   Appearance: She is well-developed.  ?HENT:  ?   Head: Normocephalic and  atraumatic.  ?Eyes:  ?   Conjunctiva/sclera: Conjunctivae normal.  ?Cardiovascular:  ?   Rate and Rhythm: Regular rhythm. Tachycardia present.  ?   Pulses: Normal pulses.  ?   Heart sounds: No murmur heard. ?Pulmonary:  ?   Effort: Pulmonary effort is normal. No respiratory distress.  ?   Breath sounds: Normal breath sounds.  ?Abdominal:  ?   Palpations: Abdomen is soft.  ?   Tenderness: There is no abdominal tenderness.  ?Musculoskeletal:     ?   General: No swelling.  ?   Cervical back: Neck supple.  ?Skin: ?   General: Skin is warm and dry.  ?   Capillary Refill: Capillary refill takes less than 2 seconds.  ?Neurological:  ?   Mental Status: She is alert.  ?   Comments: MENTAL STATUS EXAM:    ?Orientation: Alert and oriented to person, place and time.  ?Memory: Cooperative, follows commands well.  ?Language: Speech is clear and language is normal.  ? ?CRANIAL NERVES:    ?CN 2 (Optic): Visual fields intact to confrontation.  ?CN 3,4,6 (EOM): Pupils equal and reactive to light. Full extraocular eye movement without nystagmus.  ?CN 5 (Trigeminal): Facial sensation is normal, no weakness of masticatory muscles.  ?CN 7 (Facial): No facial weakness or asymmetry.  ?CN  8 (Auditory): Auditory acuity grossly normal.  ?CN 9,10 (Glossophar): The uvula is midline, the palate elevates symmetrically.  ?CN 11 (spinal access): Normal sternocleidomastoid and trapezius strength.  ?CN 12 (Hypoglossal): The tongue is midline. No atrophy or fasciculations..  ? ?MOTOR:  Muscle Strength: 4/5RUE, 5/5LUE, 3/5RLE, 5/5LLE.  ? ?COORDINATION:   Intact finger-to-nose, no tremor.  ? ?SENSATION:   Intact to light touch all four extremities with diminished sensation to light touch in the right upper and lower extremity. ? ?GAIT: Gait deferred  ?  ?Psychiatric:     ?   Mood and Affect: Mood normal.  ? ? ?ED Results / Procedures / Treatments   ?Labs ?(all labs ordered are listed, but only abnormal results are displayed) ?Labs Reviewed  ?CBC WITH  DIFFERENTIAL/PLATELET - Abnormal; Notable for the following components:  ?    Result Value  ? RBC 5.21 (*)   ? MCV 78.5 (*)   ? MCH 24.8 (*)   ? All other components within normal limits  ?BASIC METABOLIC PANEL - Abnormal; Notable for the following components:  ? Potassium 3.4 (*)   ? All other components within normal limits  ?CK  ?LACTIC ACID, PLASMA  ?TROPONIN I (HIGH SENSITIVITY)  ? ? ?EKG ?EKG Interpretation ? ?Date/Time:  Tuesday Aug 30 2021 14:01:32 EDT ?Ventricular Rate:  105 ?PR Interval:  128 ?QRS Duration: 100 ?QT Interval:  347 ?QTC Calculation: 459 ?R Axis:   59 ?Text Interpretation: Sinus tachycardia Borderline repolarization abnormality Confirmed by Ernie AvenaLawsing, Eshaan Titzer ([id number]) on 08/30/2021 2:52:10 PM ? ?Radiology ?MR BRAIN WO CONTRAST ? ?Result Date: 08/30/2021 ?CLINICAL DATA:  Right arm numbness EXAM: MRI HEAD WITHOUT CONTRAST TECHNIQUE: Multiplanar, multiecho pulse sequences of the brain and surrounding structures were obtained without intravenous contrast. COMPARISON:  None Available. FINDINGS: Brain: No acute infarct, mass effect or extra-axial collection. No acute or chronic hemorrhage. Normal white matter signal, parenchymal volume and CSF spaces. The midline structures are normal. Vascular: Major flow voids are preserved. Skull and upper cervical spine: Normal calvarium and skull base. Visualized upper cervical spine and soft tissues are normal. Sinuses/Orbits:Mild paranasal sinus mucosal thickening. Normal orbits. IMPRESSION: Normal brain MRI. Electronically Signed   By: Deatra RobinsonKevin  Herman M.D.   On: 08/30/2021 19:24  ? ?MR Cervical Spine Wo Contrast ? ?Result Date: 08/30/2021 ?CLINICAL DATA:  Acute cervical myelopathy EXAM: MRI CERVICAL SPINE WITHOUT CONTRAST TECHNIQUE: Multiplanar, multisequence MR imaging of the cervical spine was performed. No intravenous contrast was administered. COMPARISON:  None Available. FINDINGS: Alignment: Normal Vertebrae: Normal bone marrow.  Negative for fracture or mass Cord:  Normal signal and morphology Posterior Fossa, vertebral arteries, paraspinal tissues: Negative Disc levels: Normal disc spaces. No significant degenerative change. Negative for disc protrusion or stenosis. IMPRESSION: Normal MRI cervical spine. Electronically Signed   By: Marlan Palauharles  Clark M.D.   On: 08/30/2021 19:37   ? ?Procedures ?Procedures  ? ? ?Medications Ordered in ED ?Medications - No data to display ? ?ED Course/ Medical Decision Making/ A&P ?  ?                        ?Medical Decision Making ?Amount and/or Complexity of Data Reviewed ?Labs: ordered. ?Radiology: ordered. ? ? ?26 year old female who presents to the emergency department after an electric shock.  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  No loss of consciousness.  No chest  pain or shortness of breath, palpitations or syncope.  She states that since the incident, her right arm and hand and right leg have felt numb and heavy. ? ?Presenting after an electrocution.  On arrival, the patient was mildly tachycardic, subsequently NSR on telemetry, normotensive, saturating well on room air.  Patient presenting with new neurologic deficits after an electrocution.  On exam, the patient had 3 out of 5 strength in the right lower extremity and 4 out of 5 strength in the right upper extremity with diminished sensation to light touch in the right hemibody.  She has no chest pain or shortness of breath.  She had no syncope.  She denied any palpitations.  An EKG was performed which revealed no abnormal intervals, no ST segment changes. ? ?Screening laboratory work-up performed with a negative lactic acid, normal troponin, normal CK, BMP with mild hypokalemia to 3.4, otherwise unremarkable, CBC without a leukocytosis or anemia. ? ?Given the patient's acute neurologic deficits following electrocution, I did discuss the patient with on-call neurology, Dr. Wilford Corner who  recommended MRI imaging of the brain and cervical spine.  MRI imaging pending at time of signout.  Signout given to Dr. Wilkie Aye at [phone].  Plan to reassess the patient following imaging work-up. ? ? ?Final Clinical Impression(s

## 2021-08-30 NOTE — ED Triage Notes (Addendum)
Pt arrived via POV, states she was at work today, went to [employer], felt shock. States right hand/arm/ and leg all numb. Cap refill WNL. Denies any CP or SOB ?

## 2021-09-01 DIAGNOSIS — M792 Neuralgia and neuritis, unspecified: Secondary | ICD-10-CM | POA: Diagnosis not present

## 2021-09-01 DIAGNOSIS — R531 Weakness: Secondary | ICD-10-CM | POA: Diagnosis not present

## 2021-09-14 DIAGNOSIS — R208 Other disturbances of skin sensation: Secondary | ICD-10-CM | POA: Diagnosis not present

## 2021-09-21 DIAGNOSIS — R531 Weakness: Secondary | ICD-10-CM | POA: Diagnosis not present

## 2021-09-21 DIAGNOSIS — M792 Neuralgia and neuritis, unspecified: Secondary | ICD-10-CM | POA: Diagnosis not present

## 2021-10-12 DIAGNOSIS — R531 Weakness: Secondary | ICD-10-CM | POA: Diagnosis not present

## 2021-10-12 DIAGNOSIS — M79641 Pain in right hand: Secondary | ICD-10-CM | POA: Diagnosis not present

## 2021-10-12 DIAGNOSIS — R262 Difficulty in walking, not elsewhere classified: Secondary | ICD-10-CM | POA: Diagnosis not present

## 2021-10-12 DIAGNOSIS — M79671 Pain in right foot: Secondary | ICD-10-CM | POA: Diagnosis not present

## 2021-10-18 DIAGNOSIS — M79641 Pain in right hand: Secondary | ICD-10-CM | POA: Diagnosis not present

## 2021-10-18 DIAGNOSIS — R531 Weakness: Secondary | ICD-10-CM | POA: Diagnosis not present

## 2021-10-18 DIAGNOSIS — M79671 Pain in right foot: Secondary | ICD-10-CM | POA: Diagnosis not present

## 2021-10-18 DIAGNOSIS — R262 Difficulty in walking, not elsewhere classified: Secondary | ICD-10-CM | POA: Diagnosis not present

## 2021-10-25 DIAGNOSIS — R531 Weakness: Secondary | ICD-10-CM | POA: Diagnosis not present

## 2021-10-25 DIAGNOSIS — M79671 Pain in right foot: Secondary | ICD-10-CM | POA: Diagnosis not present

## 2021-10-25 DIAGNOSIS — R262 Difficulty in walking, not elsewhere classified: Secondary | ICD-10-CM | POA: Diagnosis not present

## 2021-10-25 DIAGNOSIS — M79641 Pain in right hand: Secondary | ICD-10-CM | POA: Diagnosis not present

## 2021-10-28 DIAGNOSIS — M79671 Pain in right foot: Secondary | ICD-10-CM | POA: Diagnosis not present

## 2021-10-28 DIAGNOSIS — R531 Weakness: Secondary | ICD-10-CM | POA: Diagnosis not present

## 2021-10-28 DIAGNOSIS — R262 Difficulty in walking, not elsewhere classified: Secondary | ICD-10-CM | POA: Diagnosis not present

## 2021-10-28 DIAGNOSIS — M79641 Pain in right hand: Secondary | ICD-10-CM | POA: Diagnosis not present

## 2021-11-02 DIAGNOSIS — R531 Weakness: Secondary | ICD-10-CM | POA: Diagnosis not present

## 2021-11-02 DIAGNOSIS — R350 Frequency of micturition: Secondary | ICD-10-CM | POA: Diagnosis not present

## 2021-11-02 DIAGNOSIS — Z124 Encounter for screening for malignant neoplasm of cervix: Secondary | ICD-10-CM | POA: Diagnosis not present

## 2021-11-02 DIAGNOSIS — R202 Paresthesia of skin: Secondary | ICD-10-CM | POA: Diagnosis not present

## 2021-11-02 DIAGNOSIS — Z01419 Encounter for gynecological examination (general) (routine) without abnormal findings: Secondary | ICD-10-CM | POA: Diagnosis not present

## 2021-11-07 DIAGNOSIS — F439 Reaction to severe stress, unspecified: Secondary | ICD-10-CM | POA: Diagnosis not present

## 2021-11-10 ENCOUNTER — Inpatient Hospital Stay: Admission: RE | Admit: 2021-11-10 | Payer: BC Managed Care – PPO | Source: Ambulatory Visit

## 2021-11-10 DIAGNOSIS — M79641 Pain in right hand: Secondary | ICD-10-CM | POA: Diagnosis not present

## 2021-11-10 DIAGNOSIS — R262 Difficulty in walking, not elsewhere classified: Secondary | ICD-10-CM | POA: Diagnosis not present

## 2021-11-10 DIAGNOSIS — M79671 Pain in right foot: Secondary | ICD-10-CM | POA: Diagnosis not present

## 2021-11-10 DIAGNOSIS — R531 Weakness: Secondary | ICD-10-CM | POA: Diagnosis not present

## 2021-11-17 DIAGNOSIS — R531 Weakness: Secondary | ICD-10-CM | POA: Diagnosis not present

## 2021-11-17 DIAGNOSIS — M79641 Pain in right hand: Secondary | ICD-10-CM | POA: Diagnosis not present

## 2021-11-17 DIAGNOSIS — M79671 Pain in right foot: Secondary | ICD-10-CM | POA: Diagnosis not present

## 2021-11-17 DIAGNOSIS — R262 Difficulty in walking, not elsewhere classified: Secondary | ICD-10-CM | POA: Diagnosis not present

## 2021-11-21 DIAGNOSIS — F439 Reaction to severe stress, unspecified: Secondary | ICD-10-CM | POA: Diagnosis not present

## 2021-11-28 DIAGNOSIS — R202 Paresthesia of skin: Secondary | ICD-10-CM | POA: Diagnosis not present

## 2021-11-28 DIAGNOSIS — M5031 Other cervical disc degeneration,  high cervical region: Secondary | ICD-10-CM | POA: Diagnosis not present

## 2021-11-29 DIAGNOSIS — F322 Major depressive disorder, single episode, severe without psychotic features: Secondary | ICD-10-CM | POA: Diagnosis not present

## 2021-11-29 DIAGNOSIS — F439 Reaction to severe stress, unspecified: Secondary | ICD-10-CM | POA: Diagnosis not present

## 2021-12-01 DIAGNOSIS — R531 Weakness: Secondary | ICD-10-CM | POA: Diagnosis not present

## 2021-12-01 DIAGNOSIS — R202 Paresthesia of skin: Secondary | ICD-10-CM | POA: Diagnosis not present

## 2021-12-05 DIAGNOSIS — M792 Neuralgia and neuritis, unspecified: Secondary | ICD-10-CM | POA: Diagnosis not present

## 2021-12-05 DIAGNOSIS — R531 Weakness: Secondary | ICD-10-CM | POA: Diagnosis not present

## 2021-12-05 DIAGNOSIS — T754XXD Electrocution, subsequent encounter: Secondary | ICD-10-CM | POA: Diagnosis not present

## 2021-12-08 DIAGNOSIS — R262 Difficulty in walking, not elsewhere classified: Secondary | ICD-10-CM | POA: Diagnosis not present

## 2021-12-08 DIAGNOSIS — M79671 Pain in right foot: Secondary | ICD-10-CM | POA: Diagnosis not present

## 2021-12-08 DIAGNOSIS — R531 Weakness: Secondary | ICD-10-CM | POA: Diagnosis not present

## 2021-12-08 DIAGNOSIS — M79641 Pain in right hand: Secondary | ICD-10-CM | POA: Diagnosis not present

## 2021-12-16 DIAGNOSIS — R531 Weakness: Secondary | ICD-10-CM | POA: Diagnosis not present

## 2021-12-16 DIAGNOSIS — M79641 Pain in right hand: Secondary | ICD-10-CM | POA: Diagnosis not present

## 2021-12-16 DIAGNOSIS — R262 Difficulty in walking, not elsewhere classified: Secondary | ICD-10-CM | POA: Diagnosis not present

## 2021-12-16 DIAGNOSIS — M79671 Pain in right foot: Secondary | ICD-10-CM | POA: Diagnosis not present

## 2021-12-21 DIAGNOSIS — F439 Reaction to severe stress, unspecified: Secondary | ICD-10-CM | POA: Diagnosis not present

## 2022-01-11 DIAGNOSIS — F439 Reaction to severe stress, unspecified: Secondary | ICD-10-CM | POA: Diagnosis not present

## 2022-01-13 DIAGNOSIS — R262 Difficulty in walking, not elsewhere classified: Secondary | ICD-10-CM | POA: Diagnosis not present

## 2022-01-13 DIAGNOSIS — M79641 Pain in right hand: Secondary | ICD-10-CM | POA: Diagnosis not present

## 2022-01-13 DIAGNOSIS — M79671 Pain in right foot: Secondary | ICD-10-CM | POA: Diagnosis not present

## 2022-01-13 DIAGNOSIS — R531 Weakness: Secondary | ICD-10-CM | POA: Diagnosis not present

## 2022-01-18 DIAGNOSIS — R262 Difficulty in walking, not elsewhere classified: Secondary | ICD-10-CM | POA: Diagnosis not present

## 2022-01-18 DIAGNOSIS — M79641 Pain in right hand: Secondary | ICD-10-CM | POA: Diagnosis not present

## 2022-01-18 DIAGNOSIS — M79671 Pain in right foot: Secondary | ICD-10-CM | POA: Diagnosis not present

## 2022-01-18 DIAGNOSIS — R531 Weakness: Secondary | ICD-10-CM | POA: Diagnosis not present

## 2022-01-23 DIAGNOSIS — M79671 Pain in right foot: Secondary | ICD-10-CM | POA: Diagnosis not present

## 2022-01-23 DIAGNOSIS — M79641 Pain in right hand: Secondary | ICD-10-CM | POA: Diagnosis not present

## 2022-01-23 DIAGNOSIS — R262 Difficulty in walking, not elsewhere classified: Secondary | ICD-10-CM | POA: Diagnosis not present

## 2022-01-23 DIAGNOSIS — R531 Weakness: Secondary | ICD-10-CM | POA: Diagnosis not present

## 2022-05-24 ENCOUNTER — Emergency Department (HOSPITAL_COMMUNITY): Payer: Medicaid Other

## 2022-05-24 ENCOUNTER — Emergency Department (HOSPITAL_COMMUNITY)
Admission: EM | Admit: 2022-05-24 | Discharge: 2022-05-24 | Disposition: A | Discharge status: Home / Self Care | Payer: Medicaid Other | Attending: Emergency Medicine | Admitting: Emergency Medicine

## 2022-05-24 ENCOUNTER — Other Ambulatory Visit: Payer: Self-pay

## 2022-05-24 DIAGNOSIS — W010XXA Fall on same level from slipping, tripping and stumbling without subsequent striking against object, initial encounter: Secondary | ICD-10-CM | POA: Diagnosis not present

## 2022-05-24 DIAGNOSIS — S6391XA Sprain of unspecified part of right wrist and hand, initial encounter: Secondary | ICD-10-CM

## 2022-05-24 DIAGNOSIS — S6991XA Unspecified injury of right wrist, hand and finger(s), initial encounter: Secondary | ICD-10-CM | POA: Diagnosis present

## 2022-05-24 MED ORDER — IBUPROFEN 600 MG PO TABS: 600.0000 mg | ORAL_TABLET | Freq: Four times a day (QID) | ORAL | 0 refills | Status: DC | PRN

## 2022-05-24 MED ORDER — CYCLOBENZAPRINE HCL 10 MG PO TABS: 10.0000 mg | ORAL_TABLET | Freq: Two times a day (BID) | ORAL | 0 refills | Status: DC | PRN

## 2022-05-24 NOTE — ED Triage Notes (Signed)
Pt. Stated, I fell last night holding my daughter and I hurt my hand and wrist.

## 2022-05-24 NOTE — ED Provider Notes (Signed)
[facility] Provider Note   CSN: [id number] Arrival date & time: 05/24/22  4097     History  Chief Complaint  Patient presents with   Fall   Hand Pain   Wrist Pain    Linda Curry is a 27 y.o. female.  The history is provided by the patient. No language interpreter was used.  Fall  Hand Pain  Wrist Pain     27 year old female presenting for evaluation of right hand injury.  Patient states yesterday she was carrying her 53-year-old child through [place], but slipped on wet ground and having to extend her right dominant arm to break her fall.  She denies hitting her head or loss of consciousness.  She does complain of pain to her right hand.  Pain is sharp throbbing involving the ulnar aspect of the hand and moderate in severity.  No significant wrist elbow or shoulder pain.  No other injury.  She tries Tylenol without adequate relief.  No numbness.  Home Medications Prior to Admission medications   Medication Sig Start Date End Date Taking? Authorizing Provider  diphenhydrAMINE (BENADRYL) 25 MG tablet Take 1 tablet (25 mg total) by mouth every 6 (six) hours as needed for up to 1 day. 12/24/18 12/25/18  Joy, Shawn C, PA-C  EPINEPHrine 0.3 mg/0.3 mL IJ SOAJ injection Inject 0.3 mLs (0.3 mg total) into the muscle as needed for anaphylaxis. 12/24/18   Joy, Shawn C, PA-C  famotidine (PEPCID) 20 MG tablet Take 1 tablet (20 mg total) by mouth 2 (two) times daily for 3 days. 12/24/18 12/27/18  Joy, Helane Gunther, PA-C  Prenatal Vit-Fe Fumarate-FA (PRENATAL MULTIVITAMIN) TABS tablet Take 1 tablet by mouth daily at 12 noon.    [provider]  Pyridoxine HCl (B-6 PO) Take 1 tablet by mouth at bedtime.    [provider]      Allergies    Banana    Review of Systems   Review of Systems  All other systems reviewed and are negative.   Physical Exam Updated Vital Signs BP 121/70   Pulse 81   Temp 98.7 F (37.1 C) (Oral)    Resp 17   Ht 5' (1.524 m)   Wt 63.5 kg   LMP 04/25/2022   SpO2 99%   BMI 27.34 kg/m  Physical Exam Vitals and nursing note reviewed.  Constitutional:      General: She is not in acute distress.    Appearance: She is well-developed.  HENT:     Head: Atraumatic.  Eyes:     Conjunctiva/sclera: Conjunctivae normal.  Pulmonary:     Effort: Pulmonary effort is normal.  Musculoskeletal:        General: Tenderness (Right hand: Tenderness along the hypothenar eminence and fifth digit with full range of motion and no bruising no deformed noted.) present.     Cervical back: Neck supple.     Comments: Right wrist nontender, radial pulse 2+.  Skin:    Findings: No rash.  Neurological:     Mental Status: She is alert.  Psychiatric:        Mood and Affect: Mood normal.     ED Results / Procedures / Treatments   Labs (all labs ordered are listed, but only abnormal results are displayed) Labs Reviewed - No data to display  EKG None  Radiology DG Hand Complete Right  Result Date: 05/24/2022 CLINICAL DATA:  Fall on outstretched hand last night, pain in the  small finger. EXAM: RIGHT HAND - COMPLETE 3+ VIEW COMPARISON:  09/28/2003 wrist radiographs FINDINGS: There is no evidence of fracture or dislocation. There is no evidence of arthropathy or other focal bone abnormality. Soft tissues are unremarkable. IMPRESSION: Negative. Electronically Signed   By: Van Clines M.D.   On: 05/24/2022 10:12    Procedures Procedures    Medications Ordered in ED Medications - No data to display  ED Course/ Medical Decision Making/ A&P                             Medical Decision Making Amount and/or Complexity of Data Reviewed Radiology: ordered.   BP 121/70   Pulse 81   Temp 98.7 F (37.1 C) (Oral)   Resp 17   Ht 5' (1.524 m)   Wt 63.5 kg   LMP 04/25/2022   SpO2 99%   BMI 27.34 kg/m   41:70 AM 27 year old female presenting for evaluation of right hand injury.  Patient  states yesterday she was carrying her 65-year-old child through [name], but slipped on wet ground and having to extend her right dominant arm to break her fall.  She denies hitting her head or loss of consciousness.  She does complain of pain to her right hand.  Pain is sharp throbbing involving the ulnar aspect of the hand and moderate in severity.  No significant wrist elbow or shoulder pain.  No other injury.  She tries Tylenol without adequate relief.  No numbness.  On exam this is a well-appearing female appears to be in no acute discomfort.  Right hand with tenderness along the hypothenar eminence and the fifth digit with full range of motion and no deformity noted.  X-ray of right hand obtained independently viewed and treatment by me and and agree with radiologist interpretation.  Fortunately x-ray did not show any fracture or dislocation.  I gave patient reassurance, will provide symptomatic treatment which include ibuprofen and Flexeril for symptom control.  Recommend rice therapy.        Final Clinical Impression(s) / ED Diagnoses Final diagnoses:  Hand sprain, right, initial encounter    Rx / DC Orders ED Discharge Orders          Ordered    ibuprofen (ADVIL) 600 MG tablet  Every 6 hours PRN        05/24/22 1020    cyclobenzaprine (FLEXERIL) 10 MG tablet  2 times daily PRN        05/24/22 1020              Domenic Moras, PA-C 05/24/22 1021    Cristie Hem, MD 05/24/22 1132

## 2022-05-24 NOTE — ED Provider Triage Note (Signed)
Emergency Medicine Provider Triage Evaluation Note  Linda Curry , a 27 y.o. female  was evaluated in triage.  Pt complains of hand injury. Linda Curry last night when slipped on wet floor.  Injured R hand, no wrist pain or elbow or shoulder pain.    Review of Systems  Positive: As above Negative: As above  Physical Exam  BP 121/70   Pulse 81   Temp 98.7 F (37.1 C) (Oral)   Resp 17   Ht 5' (1.524 m)   Wt 63.5 kg   LMP 04/25/2022   SpO2 99%   BMI 27.34 kg/m  Gen:   Awake, no distress   Resp:  Normal effort  MSK:   Moves extremities without difficulty  Other:  Ttp R hand  Medical Decision Making  Medically screening exam initiated at 9:47 AM.  Appropriate orders placed.  Linda Curry was informed that the remainder of the evaluation will be completed by another provider, this initial triage assessment does not replace that evaluation, and the importance of remaining in the ED until their evaluation is complete.     Domenic Moras, PA-C 05/24/22 [phone]

## 2022-08-23 ENCOUNTER — Emergency Department (HOSPITAL_COMMUNITY)
Admission: EM | Admit: 2022-08-23 | Discharge: 2022-08-23 | Disposition: A | Discharge status: Home / Self Care | Payer: 59 | Attending: Emergency Medicine | Admitting: Emergency Medicine

## 2022-08-23 ENCOUNTER — Other Ambulatory Visit: Payer: Self-pay

## 2022-08-23 ENCOUNTER — Emergency Department (HOSPITAL_COMMUNITY): Payer: 59

## 2022-08-23 DIAGNOSIS — O219 Vomiting of pregnancy, unspecified: Secondary | ICD-10-CM | POA: Insufficient documentation

## 2022-08-23 DIAGNOSIS — R197 Diarrhea, unspecified: Secondary | ICD-10-CM | POA: Insufficient documentation

## 2022-08-23 DIAGNOSIS — R1031 Right lower quadrant pain: Secondary | ICD-10-CM | POA: Diagnosis not present

## 2022-08-23 DIAGNOSIS — Z3A01 Less than 8 weeks gestation of pregnancy: Secondary | ICD-10-CM | POA: Diagnosis not present

## 2022-08-23 DIAGNOSIS — O26891 Other specified pregnancy related conditions, first trimester: Secondary | ICD-10-CM | POA: Diagnosis not present

## 2022-08-23 LAB — URINALYSIS, ROUTINE W REFLEX MICROSCOPIC
Glucose, UA: NEGATIVE mg/dL
Ketones, ur: >80 mg/dL — AB
Leukocytes,Ua: NEGATIVE
Nitrite: NEGATIVE
Protein, ur: 30 mg/dL — AB
Specific Gravity, Urine: >1.03 — ABNORMAL HIGH (ref 1.005–1.030)
pH: 6 (ref 5.0–8.0)

## 2022-08-23 LAB — CBC WITH DIFFERENTIAL/PLATELET
Abs Immature Granulocytes: 0.17 10*3/uL — ABNORMAL HIGH (ref 0.00–0.07)
Basophils Absolute: 0 10*3/uL (ref 0.0–0.1)
Basophils Relative: 0 %
Eosinophils Absolute: 0.1 10*3/uL (ref 0.0–0.5)
Eosinophils Relative: 1 %
HCT: 37.3 % (ref 36.0–46.0)
Hemoglobin: 12.3 g/dL (ref 12.0–15.0)
Immature Granulocytes: 2 %
Lymphocytes Relative: 12 %
Lymphs Abs: 1.3 10*3/uL (ref 0.7–4.0)
MCH: 25.2 pg — ABNORMAL LOW (ref 26.0–34.0)
MCHC: 33 g/dL (ref 30.0–36.0)
MCV: 76.3 fL — ABNORMAL LOW (ref 80.0–100.0)
Monocytes Absolute: 0.6 10*3/uL (ref 0.1–1.0)
Monocytes Relative: 5 %
Neutro Abs: 8.9 10*3/uL — ABNORMAL HIGH (ref 1.7–7.7)
Neutrophils Relative %: 80 %
Platelets: 315 10*3/uL (ref 150–400)
RBC: 4.89 MIL/uL (ref 3.87–5.11)
RDW: 14.8 % (ref 11.5–15.5)
WBC: 11 10*3/uL — ABNORMAL HIGH (ref 4.0–10.5)
nRBC: 0 % (ref 0.0–0.2)

## 2022-08-23 LAB — COMPREHENSIVE METABOLIC PANEL
ALT: 17 U/L (ref 0–44)
AST: 18 U/L (ref 15–41)
Albumin: 3.6 g/dL (ref 3.5–5.0)
Alkaline Phosphatase: 75 U/L (ref 38–126)
Anion gap: 9 (ref 5–15)
BUN: 6 mg/dL (ref 6–20)
CO2: 22 mmol/L (ref 22–32)
Calcium: 9.6 mg/dL (ref 8.9–10.3)
Chloride: 105 mmol/L (ref 98–111)
Creatinine, Ser: 0.64 mg/dL (ref 0.44–1.00)
GFR, Estimated: >60 mL/min (ref >60)
Glucose, Bld: 107 mg/dL — ABNORMAL HIGH (ref 70–99)
Potassium: 4.1 mmol/L (ref 3.5–5.1)
Sodium: 136 mmol/L (ref 135–145)
Total Bilirubin: 0.7 mg/dL (ref 0.3–1.2)
Total Protein: 7.9 g/dL (ref 6.5–8.1)

## 2022-08-23 LAB — I-STAT BETA HCG BLOOD, ED (MC, WL, AP ONLY): I-stat hCG, quantitative: >2000 m[IU]/mL — ABNORMAL HIGH (ref <5)

## 2022-08-23 LAB — URINALYSIS, MICROSCOPIC (REFLEX)

## 2022-08-23 LAB — LIPASE, BLOOD: Lipase: 29 U/L (ref 11–51)

## 2022-08-23 MED ORDER — SODIUM CHLORIDE 0.9 % IV BOLUS
1000.0000 mL | Freq: Once | INTRAVENOUS | Status: AC
  Administered 2022-08-23: 1000 mL via INTRAVENOUS

## 2022-08-23 MED ORDER — ONDANSETRON HCL 4 MG/2ML IJ SOLN
4.0000 mg | Freq: Once | INTRAMUSCULAR | Status: AC
  Administered 2022-08-23: 4 mg via INTRAVENOUS
  Filled 2022-08-23: qty 2

## 2022-08-23 MED ORDER — FENTANYL CITRATE PF 50 MCG/ML IJ SOSY
50.0000 ug | PREFILLED_SYRINGE | Freq: Once | INTRAMUSCULAR | Status: AC
  Administered 2022-08-23: 50 ug via INTRAVENOUS
  Filled 2022-08-23: qty 1

## 2022-08-23 NOTE — ED Triage Notes (Signed)
Pt. Stated,, Ive had abdominal pain for a week with N/V

## 2022-08-23 NOTE — ED Provider Notes (Signed)
[place] EMERGENCY DEPARTMENT AT [facility] Provider Note   CSN: [id number] Arrival date & time: 08/23/22  0915     History  Chief Complaint  Patient presents with   Abdominal Pain   Nausea   Emesis    Linda Curry is a 27 y.o. female.  27 year old female with no past medical history presents to the ED with a chief complaint of lower abdominal pain for the past week.  Describes as a stabbing, cramping sensation to the right lower quadrant radiating to her entire abdomen.  She also endorses some anorexia.  Exacerbated with movement along with oral intake, although this has been decreased.  She has been taking ibuprofen for the pain without any improvement in symptoms.  Last menstrual cycle was on March 27.  Did have an episode of diarrhea at this morning without any blood in her stool.  Denies any vomiting, fevers, prior surgical intervention of her abdomen.  Vaginal discharge, no vaginal bleeding.  The history is provided by the patient and medical records.  Abdominal Pain Pain location:  R flank and RLQ Associated symptoms: diarrhea, nausea and vomiting   Associated symptoms: no chest pain, no chills, no fever, no shortness of breath, no vaginal bleeding and no vaginal discharge   Emesis Associated symptoms: abdominal pain and diarrhea   Associated symptoms: no chills and no fever        Home Medications Prior to Admission medications   Medication Sig Start Date End Date Taking? Authorizing Provider  cyclobenzaprine (FLEXERIL) 10 MG tablet Take 1 tablet (10 mg total) by mouth 2 (two) times daily as needed for muscle spasms. 05/24/22   Fayrene Helper, PA-C  diphenhydrAMINE (BENADRYL) 25 MG tablet Take 1 tablet (25 mg total) by mouth every 6 (six) hours as needed for up to 1 day. 12/24/18 12/25/18  Joy, Shawn C, PA-C  EPINEPHrine 0.3 mg/0.3 mL IJ SOAJ injection Inject 0.3 mLs (0.3 mg total) into the muscle as needed for anaphylaxis. 12/24/18   Joy, Shawn C, PA-C   famotidine (PEPCID) 20 MG tablet Take 1 tablet (20 mg total) by mouth 2 (two) times daily for 3 days. 12/24/18 12/27/18  Joy, Shawn C, PA-C  ibuprofen (ADVIL) 600 MG tablet Take 1 tablet (600 mg total) by mouth every 6 (six) hours as needed. 05/24/22   Fayrene Helper, PA-C  Prenatal Vit-Fe Fumarate-FA (PRENATAL MULTIVITAMIN) TABS tablet Take 1 tablet by mouth daily at 12 noon.    [provider]  Pyridoxine HCl (B-6 PO) Take 1 tablet by mouth at bedtime.    [provider]      Allergies    Banana    Review of Systems   Review of Systems  Constitutional:  Negative for chills and fever.  Respiratory:  Negative for shortness of breath.   Cardiovascular:  Negative for chest pain.  Gastrointestinal:  Positive for abdominal pain, diarrhea, nausea and vomiting.  Genitourinary:  Negative for difficulty urinating, vaginal bleeding and vaginal discharge.  Musculoskeletal:  Negative for back pain.  All other systems reviewed and are negative.   Physical Exam Updated Vital Signs BP 122/79 (BP Location: Right Arm)   Pulse 90   Temp 98.2 F (36.8 C) (Oral)   Resp 18   Ht 5' (1.524 m)   Wt 66.7 kg   LMP 07/26/2022   SpO2 99%   BMI 28.71 kg/m  Physical Exam Vitals and nursing note reviewed.  Constitutional:      General: She is not  in acute distress.    Appearance: She is well-developed.  HENT:     Head: Normocephalic and atraumatic.     Mouth/Throat:     Pharynx: No oropharyngeal exudate.  Eyes:     Pupils: Pupils are equal, round, and reactive to light.  Cardiovascular:     Rate and Rhythm: Regular rhythm.     Heart sounds: Normal heart sounds.  Pulmonary:     Effort: Pulmonary effort is normal. No respiratory distress.     Breath sounds: Normal breath sounds.  Abdominal:     General: Bowel sounds are normal. There is no distension.     Palpations: Abdomen is soft.     Tenderness: There is abdominal tenderness in the right lower quadrant. There is no right CVA  tenderness, left CVA tenderness, guarding or rebound. Positive signs include McBurney's sign.  Musculoskeletal:        General: No tenderness or deformity.     Cervical back: Normal range of motion.     Right lower leg: No edema.     Left lower leg: No edema.  Skin:    General: Skin is warm and dry.  Neurological:     Mental Status: She is alert and oriented to person, place, and time.     ED Results / Procedures / Treatments   Labs (all labs ordered are listed, but only abnormal results are displayed) Labs Reviewed  CBC WITH DIFFERENTIAL/PLATELET - Abnormal; Notable for the following components:      Result Value   WBC 11.0 (*)    MCV 76.3 (*)    MCH 25.2 (*)    Neutro Abs 8.9 (*)    Abs Immature Granulocytes 0.17 (*)    All other components within normal limits  COMPREHENSIVE METABOLIC PANEL - Abnormal; Notable for the following components:   Glucose, Bld 107 (*)    All other components within normal limits  URINALYSIS, ROUTINE W REFLEX MICROSCOPIC - Abnormal; Notable for the following components:   APPearance HAZY (*)    Specific Gravity, Urine >1.030 (*)    Hgb urine dipstick TRACE (*)    Bilirubin Urine SMALL (*)    Ketones, ur >80 (*)    Protein, ur 30 (*)    All other components within normal limits  URINALYSIS, MICROSCOPIC (REFLEX) - Abnormal; Notable for the following components:   Bacteria, UA MANY (*)    All other components within normal limits  I-STAT BETA HCG BLOOD, ED (MC, WL, AP ONLY) - Abnormal; Notable for the following components:   I-stat hCG, quantitative >2,000.0 (*)    All other components within normal limits  LIPASE, BLOOD  PREGNANCY, URINE    EKG None  Radiology US OB Comp < 14 Wks  Result Date: 08/23/2022 CLINICAL DATA:  Nausea, vomiting, and pelvic pain for 1 week during early pregnancy. Positive hCG. Estimated gestational age by LMP is 4 weeks 0 days. EXAM: OBSTETRIC <14 WK [place] AND TRANSVAGINAL OB US TECHNIQUE: Both transabdominal and  transvaginal ultrasound examinations were performed for complete evaluation of the gestation as well as the maternal uterus, adnexal regions, and pelvic cul-de-sac. Transvaginal technique was performed to assess early pregnancy. COMPARISON:  None Available. FINDINGS: Intrauterine gestational sac: Single Yolk sac:  Visualized. Embryo:  Visualized. Cardiac Activity: Visualized. Heart Rate: 145 bpm CRL:  2.09 mm   5 w   5 d                  [place] EDC: 04/20/2023 Subchorionic  hemorrhage:  None visualized. Maternal uterus/adnexae: Uterus is anteverted. No myometrial mass lesions. No abnormal adnexal masses. Both ovaries are visualized and appear normal. Minimal free fluid. IMPRESSION: Single intrauterine pregnancy. Estimated gestational age by crown-rump length is 5 weeks 5 days. No acute complication demonstrated sonographically. Electronically Signed   By: Burman Nieves M.D.   On: 08/23/2022 15:08    Procedures Procedures    Medications Ordered in ED Medications  sodium chloride 0.9 % bolus 1,000 mL (1,000 mLs Intravenous New Bag/Given 08/23/22 1329)  ondansetron (ZOFRAN) injection 4 mg (4 mg Intravenous Given 08/23/22 1327)  fentaNYL (SUBLIMAZE) injection 50 mcg (50 mcg Intravenous Given 08/23/22 1327)    ED Course/ Medical Decision Making/ A&P Clinical Course as of 08/23/22 1536  Wed Aug 23, 2022  1301 Bacteria, UA(!): MANY [JS]  1301 WBC, UA: 0-5 [JS]    Clinical Course User Index [JS] Claude Manges, PA-C                             Medical Decision Making Amount and/or Complexity of Data Reviewed Labs: ordered. Decision-making details documented in ED Course. Radiology: ordered.  Risk Prescription drug management.     This patient presents to the ED for concern of abdominal pain, this involves a number of treatment options, and is a complaint that carries with it a high risk of complications and morbidity.  The differential diagnosis includes pregnancy, appendicitis versus viral  illness.   Co morbidities: Discussed in HPI   Brief History:  See HPI.   EMR reviewed including pt PMHx, past surgical history and past visits to ER.   See HPI for more details   Lab Tests:  I ordered and independently interpreted labs.  The pertinent results include:    I personally reviewed all laboratory work and imaging. Metabolic panel without any acute abnormality specifically kidney function within normal limits and no significant electrolyte abnormalities. CBC without leukocytosis or significant anemia.   Imaging Studies:  Ultrasound OB showed: Single intrauterine pregnancy. Estimated gestational age by  crown-rump length is 5 weeks 5 days. No acute complication  demonstrated sonographically.   Medicines ordered:  I ordered medication including zofran, fentanyl,bolus  for symptomatic treatment prior to pregnancy test Reevaluation of the patient after these medicines showed that the patient improved I have reviewed the patients home medicines and have made adjustments as needed  Reevaluation:  After the interventions noted above I re-evaluated patient and found that they have :improved   Social Determinants of Health:  The patient's social determinants of health were a factor in the care of this patient  Problem List / ED Course:  Patient presents to the ED with a chief complaint of nausea, vomiting, abdominal pain has been ongoing for about a week.  Reports her last menstrual cycle was on July 26, 2022.  Is not having any vaginal discharge or vaginal bleeding.  Denies any urinary symptoms.  Has not taken any medication for improvement in symptoms aside from ibuprofen.  Does endorse some lower abdominal cramping but this also is generalized.  Reports no concern for pregnancy at this time. Blood work was obtained while in triage, CBC is unremarkable, hemoglobin stable.  CMP without any electrolyte derangement, creatinine levels unremarkable.  Bolus was given  while in the ED along with some Zofran, and fentanyl and this was all given prior to pregnancy test returning.  She reports no concern for pregnancy, although her hCG was greater  than 2000.  CT of the abdomen was canceled.  Therefore ultrasound of her abdomen was obtained which showed an IUP she is around 5 weeks and 5 days, I discussed with patient appropriate management with OB/GYN. She reports resolution in symptoms after medication . She has not had any vomiting episodes while in the emergency department.  She is hemodynamically stable and neuro to follow-up with her OB/GYN.  Patient stable for discharge. Dispostion:  After consideration of the diagnostic results and the patients response to treatment, I feel that the patent would benefit from follow up with OBGYN for her IUP, given a copy of her ultrasound.     Portions of this note were generated with Scientist, clinical (histocompatibility and immunogenetics). Dictation errors may occur despite best attempts at proofreading.  Final Clinical Impression(s) / ED Diagnoses Final diagnoses:  Less than [redacted] weeks gestation of pregnancy    Rx / DC Orders ED Discharge Orders     None         Claude Manges, PA-C 08/23/22 1536    Glyn Ade, MD 08/23/22 1615

## 2022-08-23 NOTE — Discharge Instructions (Addendum)
Your laboratories were within the limits today.  You will be given a copy of your ultrasound report which show that you are approximately 5 weeks and 5 days pregnant.   You will need to schedule an appointment with OB/GYN in order to obtain further follow up on your pregnancy.

## 2023-01-29 DIAGNOSIS — R350 Frequency of micturition: Secondary | ICD-10-CM | POA: Diagnosis not present

## 2023-01-29 DIAGNOSIS — N898 Other specified noninflammatory disorders of vagina: Secondary | ICD-10-CM | POA: Diagnosis not present

## 2023-03-01 ENCOUNTER — Emergency Department (HOSPITAL_COMMUNITY): Payer: Medicaid Other

## 2023-03-01 ENCOUNTER — Emergency Department (HOSPITAL_COMMUNITY)
Admission: EM | Admit: 2023-03-01 | Discharge: 2023-03-01 | Disposition: A | Discharge status: Home / Self Care | Payer: Medicaid Other | Attending: Emergency Medicine | Admitting: Emergency Medicine

## 2023-03-01 ENCOUNTER — Other Ambulatory Visit: Payer: Self-pay

## 2023-03-01 ENCOUNTER — Encounter (HOSPITAL_COMMUNITY): Payer: Self-pay

## 2023-03-01 DIAGNOSIS — R197 Diarrhea, unspecified: Secondary | ICD-10-CM | POA: Diagnosis present

## 2023-03-01 DIAGNOSIS — R1031 Right lower quadrant pain: Secondary | ICD-10-CM | POA: Diagnosis not present

## 2023-03-01 DIAGNOSIS — R110 Nausea: Secondary | ICD-10-CM | POA: Insufficient documentation

## 2023-03-01 LAB — COMPREHENSIVE METABOLIC PANEL
ALT: 19 U/L (ref 0–44)
AST: 19 U/L (ref 15–41)
Albumin: 3.2 g/dL — ABNORMAL LOW (ref 3.5–5.0)
Alkaline Phosphatase: 67 U/L (ref 38–126)
Anion gap: 8 (ref 5–15)
BUN: 6 mg/dL (ref 6–20)
CO2: 23 mmol/L (ref 22–32)
Calcium: 8.9 mg/dL (ref 8.9–10.3)
Chloride: 105 mmol/L (ref 98–111)
Creatinine, Ser: 0.61 mg/dL (ref 0.44–1.00)
GFR, Estimated: >60 mL/min (ref >60)
Glucose, Bld: 94 mg/dL (ref 70–99)
Potassium: 3.8 mmol/L (ref 3.5–5.1)
Sodium: 136 mmol/L (ref 135–145)
Total Bilirubin: 0.4 mg/dL (ref 0.3–1.2)
Total Protein: 7.5 g/dL (ref 6.5–8.1)

## 2023-03-01 LAB — CBC
HCT: 35.2 % — ABNORMAL LOW (ref 36.0–46.0)
Hemoglobin: 11 g/dL — ABNORMAL LOW (ref 12.0–15.0)
MCH: 24.1 pg — ABNORMAL LOW (ref 26.0–34.0)
MCHC: 31.3 g/dL (ref 30.0–36.0)
MCV: 77.2 fL — ABNORMAL LOW (ref 80.0–100.0)
Platelets: 315 10*3/uL (ref 150–400)
RBC: 4.56 MIL/uL (ref 3.87–5.11)
RDW: 13.9 % (ref 11.5–15.5)
WBC: 7.3 10*3/uL (ref 4.0–10.5)
nRBC: 0 % (ref 0.0–0.2)

## 2023-03-01 LAB — URINALYSIS, ROUTINE W REFLEX MICROSCOPIC
Bilirubin Urine: NEGATIVE
Glucose, UA: NEGATIVE mg/dL
Hgb urine dipstick: NEGATIVE
Ketones, ur: 20 mg/dL — AB
Leukocytes,Ua: NEGATIVE
Nitrite: NEGATIVE
Protein, ur: NEGATIVE mg/dL
Specific Gravity, Urine: 1.014 (ref 1.005–1.030)
pH: 6 (ref 5.0–8.0)

## 2023-03-01 LAB — HCG, SERUM, QUALITATIVE: Preg, Serum: NEGATIVE

## 2023-03-01 LAB — LIPASE, BLOOD: Lipase: 25 U/L (ref 11–51)

## 2023-03-01 MED ORDER — ACETAMINOPHEN 500 MG PO TABS
1000.0000 mg | ORAL_TABLET | Freq: Once | ORAL | Status: AC
  Administered 2023-03-01: 1000 mg via ORAL
  Filled 2023-03-01: qty 2

## 2023-03-01 MED ORDER — ONDANSETRON HCL 4 MG/2ML IJ SOLN
4.0000 mg | Freq: Once | INTRAMUSCULAR | Status: AC
  Administered 2023-03-01: 4 mg via INTRAVENOUS
  Filled 2023-03-01: qty 2

## 2023-03-01 MED ORDER — LACTATED RINGERS IV BOLUS
1000.0000 mL | Freq: Once | INTRAVENOUS | Status: AC
  Administered 2023-03-01: 1000 mL via INTRAVENOUS

## 2023-03-01 MED ORDER — ONDANSETRON 4 MG PO TBDP: 4.0000 mg | ORAL_TABLET | Freq: Three times a day (TID) | ORAL | 0 refills | Status: AC | PRN

## 2023-03-01 MED ORDER — IOHEXOL 350 MG/ML SOLN
65.0000 mL | Freq: Once | INTRAVENOUS | Status: AC | PRN
  Administered 2023-03-01: 65 mL via INTRAVENOUS

## 2023-03-01 NOTE — ED Provider Notes (Signed)
Pt is signed out by Dr. Fredderick Phenix pending CT abd/pelvis.  CT scans reviewed by me.  I agree with the radiologist.  CT:  Negative.  No acute findings or other significant abnormality.   Pt is feeling better and said diarrhea has slowed down.  Pt has not had any diarrhea here.  She did have stool studies sent yesterday from [place].  She knows to return if worse.  F/u with pcp.    Jacalyn Lefevre, MD 03/01/23 [phone]

## 2023-03-01 NOTE — ED Triage Notes (Signed)
Pt c/o mid abdominal pain, N/V/Dx5d.

## 2023-03-01 NOTE — ED Provider Notes (Signed)
[place] EMERGENCY DEPARTMENT AT [facility] Provider Note   CSN: [id number] Arrival date & time: 03/01/23  0908     History  Chief Complaint  Patient presents with   Abdominal Pain   Diarrhea   Emesis    Linda Curry is a 27 y.o. female.  Patient is a 27 year old female who presents with abdominal pain associated with diarrhea.  She says it started about 5 days ago.  She has had multiple episodes of diarrhea throughout the day.  It is watery with some blood mixed in.  She has some lower abdominal pain as well.  She has had some nausea but no vomiting.  She has felt chills but has not had any fevers.  No recent travel history.  No recent antibiotic usage.  She went to [facility] urgent care yesterday and was told that they were going to send off some stool studies including testing for E. coli and Salmonella.  She does not have those results back yet.  She was given prescriptions for some nausea medicine and advised to take antidiarrheal medications but has not started that yet.  She came in today because she was feeling little bit worse and having some chills.       Home Medications Prior to Admission medications   Medication Sig Start Date End Date Taking? Authorizing Provider  cyclobenzaprine (FLEXERIL) 10 MG tablet Take 1 tablet (10 mg total) by mouth 2 (two) times daily as needed for muscle spasms. 05/24/22   Fayrene Helper, PA-C  diphenhydrAMINE (BENADRYL) 25 MG tablet Take 1 tablet (25 mg total) by mouth every 6 (six) hours as needed for up to 1 day. 12/24/18 12/25/18  Joy, Shawn C, PA-C  EPINEPHrine 0.3 mg/0.3 mL IJ SOAJ injection Inject 0.3 mLs (0.3 mg total) into the muscle as needed for anaphylaxis. 12/24/18   Joy, Shawn C, PA-C  famotidine (PEPCID) 20 MG tablet Take 1 tablet (20 mg total) by mouth 2 (two) times daily for 3 days. 12/24/18 12/27/18  Joy, Shawn C, PA-C  ibuprofen (ADVIL) 600 MG tablet Take 1 tablet (600 mg total) by mouth every 6 (six) hours as needed.  05/24/22   Fayrene Helper, PA-C  Prenatal Vit-Fe Fumarate-FA (PRENATAL MULTIVITAMIN) TABS tablet Take 1 tablet by mouth daily at 12 noon.    [provider]  Pyridoxine HCl (B-6 PO) Take 1 tablet by mouth at bedtime.    [provider]      Allergies    Banana    Review of Systems   Review of Systems  Constitutional:  Positive for chills and fatigue. Negative for diaphoresis and fever.  HENT:  Negative for congestion, rhinorrhea and sneezing.   Eyes: Negative.   Respiratory:  Negative for cough, chest tightness and shortness of breath.   Cardiovascular:  Negative for chest pain and leg swelling.  Gastrointestinal:  Positive for abdominal pain, diarrhea and nausea. Negative for blood in stool and vomiting.  Genitourinary:  Negative for difficulty urinating, flank pain, frequency and hematuria.  Musculoskeletal:  Negative for arthralgias and back pain.  Skin:  Negative for rash.  Neurological:  Negative for dizziness, speech difficulty, weakness, numbness and headaches.    Physical Exam Updated Vital Signs BP 115/73   Pulse 82   Temp 98.4 F (36.9 C) (Oral)   Resp 17   Ht 5' (1.524 m)   Wt 66.7 kg   SpO2 100%   BMI 28.72 kg/m  Physical Exam Constitutional:  Appearance: She is well-developed.  HENT:     Head: Normocephalic and atraumatic.  Eyes:     Pupils: Pupils are equal, round, and reactive to light.  Cardiovascular:     Rate and Rhythm: Normal rate and regular rhythm.     Heart sounds: Normal heart sounds.  Pulmonary:     Effort: Pulmonary effort is normal. No respiratory distress.     Breath sounds: Normal breath sounds. No wheezing or rales.  Chest:     Chest wall: No tenderness.  Abdominal:     General: Bowel sounds are normal.     Palpations: Abdomen is soft.     Tenderness: There is abdominal tenderness in the right lower quadrant, periumbilical area, suprapubic area and left lower quadrant. There is no guarding or rebound.   Musculoskeletal:        General: Normal range of motion.     Cervical back: Normal range of motion and neck supple.  Lymphadenopathy:     Cervical: No cervical adenopathy.  Skin:    General: Skin is warm and dry.     Findings: No rash.  Neurological:     Mental Status: She is alert and oriented to person, place, and time.     ED Results / Procedures / Treatments   Labs (all labs ordered are listed, but only abnormal results are displayed) Labs Reviewed  COMPREHENSIVE METABOLIC PANEL - Abnormal; Notable for the following components:      Result Value   Albumin 3.2 (*)    All other components within normal limits  CBC - Abnormal; Notable for the following components:   Hemoglobin 11.0 (*)    HCT 35.2 (*)    MCV 77.2 (*)    MCH 24.1 (*)    All other components within normal limits  URINALYSIS, ROUTINE W REFLEX MICROSCOPIC - Abnormal; Notable for the following components:   APPearance HAZY (*)    Ketones, ur 20 (*)    All other components within normal limits  C DIFFICILE QUICK SCREEN W PCR REFLEX    LIPASE, BLOOD  HCG, SERUM, QUALITATIVE    EKG None  Radiology No results found.  Procedures Procedures    Medications Ordered in ED Medications  lactated ringers bolus 1,000 mL (1,000 mLs Intravenous New Bag/Given 03/01/23 1247)  ondansetron (ZOFRAN) injection 4 mg (4 mg Intravenous Given 03/01/23 1246)  acetaminophen (TYLENOL) tablet 1,000 mg (1,000 mg Oral Given 03/01/23 1246)  iohexol (OMNIPAQUE) 350 MG/ML injection 65 mL (65 mLs Intravenous Contrast Given 03/01/23 1415)    ED Course/ Medical Decision Making/ A&P                                 Medical Decision Making Amount and/or Complexity of Data Reviewed Labs: ordered. Radiology: ordered.  Risk OTC drugs. Prescription drug management.   Patient is a 27 year old who presents with abdominal pain associated with diarrhea.  She does have some hematochezia as well.  Labs reviewed and are nonconcerning.   .  CT scan was ordered and is pending.  She was given IV fluids and antiemetics.  Care turned over to Dr. Rikki Spearing pending results of the CT scan and reevaluation.  Final Clinical Impression(s) / ED Diagnoses Final diagnoses:  Diarrhea, unspecified type    Rx / DC Orders ED Discharge Orders     None         Rolan Bucco, MD 03/01/23 1550

## 2023-04-16 DIAGNOSIS — N898 Other specified noninflammatory disorders of vagina: Secondary | ICD-10-CM | POA: Diagnosis not present

## 2023-04-16 DIAGNOSIS — R350 Frequency of micturition: Secondary | ICD-10-CM | POA: Diagnosis not present

## 2023-07-17 ENCOUNTER — Emergency Department (HOSPITAL_BASED_OUTPATIENT_CLINIC_OR_DEPARTMENT_OTHER)
Admission: EM | Admit: 2023-07-17 | Discharge: 2023-07-17 | Disposition: A | Discharge status: Home / Self Care | Attending: Emergency Medicine | Admitting: Emergency Medicine

## 2023-07-17 ENCOUNTER — Other Ambulatory Visit: Payer: Self-pay

## 2023-07-17 ENCOUNTER — Encounter (HOSPITAL_BASED_OUTPATIENT_CLINIC_OR_DEPARTMENT_OTHER): Payer: Self-pay | Admitting: Emergency Medicine

## 2023-07-17 ENCOUNTER — Emergency Department (HOSPITAL_BASED_OUTPATIENT_CLINIC_OR_DEPARTMENT_OTHER)

## 2023-07-17 DIAGNOSIS — M654 Radial styloid tenosynovitis [de Quervain]: Secondary | ICD-10-CM | POA: Diagnosis not present

## 2023-07-17 DIAGNOSIS — M79644 Pain in right finger(s): Secondary | ICD-10-CM | POA: Diagnosis not present

## 2023-07-17 DIAGNOSIS — M25531 Pain in right wrist: Secondary | ICD-10-CM | POA: Insufficient documentation

## 2023-07-17 DIAGNOSIS — M79601 Pain in right arm: Secondary | ICD-10-CM | POA: Diagnosis not present

## 2023-07-17 DIAGNOSIS — M79641 Pain in right hand: Secondary | ICD-10-CM | POA: Diagnosis not present

## 2023-07-17 DIAGNOSIS — X58XXXA Exposure to other specified factors, initial encounter: Secondary | ICD-10-CM | POA: Insufficient documentation

## 2023-07-17 MED ORDER — CYCLOBENZAPRINE HCL 10 MG PO TABS: 10.0000 mg | ORAL_TABLET | Freq: Two times a day (BID) | ORAL | 0 refills | Status: AC | PRN

## 2023-07-17 MED ORDER — IBUPROFEN 600 MG PO TABS: 600.0000 mg | ORAL_TABLET | Freq: Four times a day (QID) | ORAL | 0 refills | Status: AC | PRN

## 2023-07-17 MED ORDER — ACETAMINOPHEN 325 MG PO TABS: 650.0000 mg | ORAL_TABLET | Freq: Four times a day (QID) | ORAL | 0 refills | Status: AC | PRN

## 2023-07-17 NOTE — ED Triage Notes (Signed)
 Pt reports she "cracked her knuckles" and felt a sharp pain up her RT arm, reports having difficulty gripping and flexing fingers, denies any injury or trauma

## 2023-07-17 NOTE — Discharge Instructions (Addendum)
 It was a pleasure caring for you today in the emergency department.  You likely have inflammation or irritation to the tendons in your right wrist.  This will hopefully improve with rest and medications you are prescribed today.  Please wear the splint in the daytime.  No heavy lifting for the next 2 weeks.  If symptoms persist please follow-up with hand specialist/orthopedics  Please return to the emergency department for any worsening or worrisome symptoms.

## 2023-07-17 NOTE — ED Provider Notes (Signed)
 [place] [facility] Provider Note  CSN: [id number] Arrival date & time: 07/17/23 1745  Chief Complaint(s) Hand Pain  HPI Linda Curry is a 28 y.o. female with past medical history as below, significant for no significant medical history who presents to the ED with complaint of right thumb/wrist pain.  Patient reports earlier today she was cracking her knuckles, experienced pain to her right thumb, right wrist.  This was around 1 PM.  Worsen when she moves her finger.  Pain seems to radiate proximally.  No numbness or tingling to her fingertips.  No recent falls or injuries.  No history of similar.  No medications prior to arrival.  Past Medical History Past Medical History:  Diagnosis Date   Infection    UTI   Patient Active Problem List   Diagnosis Date Noted   Normal labor 07/29/2019   Home Medication(s) Prior to Admission medications   Medication Sig Start Date End Date Taking? Authorizing Provider  cyclobenzaprine (FLEXERIL) 10 MG tablet Take 1 tablet (10 mg total) by mouth 2 (two) times daily as needed for muscle spasms. 05/24/22   Fayrene Helper, PA-C  diphenhydrAMINE (BENADRYL) 25 MG tablet Take 1 tablet (25 mg total) by mouth every 6 (six) hours as needed for up to 1 day. 12/24/18 12/25/18  Joy, Shawn C, PA-C  EPINEPHrine 0.3 mg/0.3 mL IJ SOAJ injection Inject 0.3 mLs (0.3 mg total) into the muscle as needed for anaphylaxis. 12/24/18   Joy, Shawn C, PA-C  famotidine (PEPCID) 20 MG tablet Take 1 tablet (20 mg total) by mouth 2 (two) times daily for 3 days. 12/24/18 12/27/18  Joy, Shawn C, PA-C  ibuprofen (ADVIL) 600 MG tablet Take 1 tablet (600 mg total) by mouth every 6 (six) hours as needed. 05/24/22   Fayrene Helper, PA-C  ondansetron (ZOFRAN-ODT) 4 MG disintegrating tablet Take 1 tablet (4 mg total) by mouth every 8 (eight) hours as needed. 03/01/23   Jacalyn Lefevre, MD  Prenatal Vit-Fe Fumarate-FA (PRENATAL MULTIVITAMIN) TABS tablet Take 1  tablet by mouth daily at 12 noon.    [provider]  Pyridoxine HCl (B-6 PO) Take 1 tablet by mouth at bedtime.    [provider]                                                                                                                                    Past Surgical History Past Surgical History:  Procedure Laterality Date   NO PAST SURGERIES     Family History Family History  Problem Relation Age of Onset   Asthma Mother    Hypertension Mother    Healthy Father    Cancer Maternal Grandmother        pancreatic   Diabetes Maternal Grandmother    Cancer Maternal Grandfather     Social History Social History   Tobacco Use   Smoking status: Never  Smokeless tobacco: Never  Vaping Use   Vaping status: Never Used  Substance Use Topics   Alcohol use: No   Drug use: No   Allergies Banana  Review of Systems A thorough review of systems was obtained and all systems are negative except as noted in the HPI and PMH.   Physical Exam Vital Signs  I have reviewed the triage vital signs BP 112/71 (BP Location: Left Arm)   Pulse 71   Temp 98.1 F (36.7 C) (Oral)   Resp 16   Ht 5' (1.524 m)   Wt 64.4 kg   LMP 06/29/2023 (Exact Date)   SpO2 99%   BMI 27.73 kg/m  Physical Exam Vitals and nursing note reviewed.  Constitutional:      General: She is not in acute distress.    Appearance: Normal appearance. She is well-developed. She is not ill-appearing.  HENT:     Head: Normocephalic and atraumatic.     Right Ear: External ear normal.     Left Ear: External ear normal.     Nose: Nose normal.     Mouth/Throat:     Mouth: Mucous membranes are moist.  Eyes:     General: No scleral icterus.       Right eye: No discharge.        Left eye: No discharge.  Cardiovascular:     Rate and Rhythm: Normal rate.  Pulmonary:     Effort: Pulmonary effort is normal. No respiratory distress.     Breath sounds: No stridor.  Abdominal:     General: Abdomen  is flat. There is no distension.     Tenderness: There is no guarding.  Musculoskeletal:        General: No deformity.     Cervical back: No rigidity.     Comments: B/L UE NVI b/l  + finkelstein test -Phalen/ -reverse Phalen   Skin:    General: Skin is warm and dry.     Coloration: Skin is not cyanotic, jaundiced or pale.  Neurological:     Mental Status: She is alert and oriented to person, place, and time.     GCS: GCS eye subscore is 4. GCS verbal subscore is 5. GCS motor subscore is 6.  Psychiatric:        Speech: Speech normal.        Behavior: Behavior normal. Behavior is cooperative.     ED Results and Treatments Labs (all labs ordered are listed, but only abnormal results are displayed) Labs Reviewed - No data to display                                                                                                                        Radiology DG Hand Complete Right Result Date: 07/17/2023 CLINICAL DATA:  Right hand pain patient was popping her knuckles when she had sharp pain going up into arm. EXAM: RIGHT HAND - COMPLETE 3+ VIEW COMPARISON:  Right hand radiographs 05/24/2022 FINDINGS: There  is no evidence of fracture or dislocation. There is no evidence of arthropathy or other focal bone abnormality. Soft tissues are unremarkable. IMPRESSION: Negative. Electronically Signed   By: Neita Garnet M.D.   On: 07/17/2023 19:41    Pertinent labs & imaging results that were available during my care of the patient were reviewed by me and considered in my medical decision making (see MDM for details).  Medications Ordered in ED Medications - No data to display                                                                                                                                   Procedures Procedures  (including critical care time)  Medical Decision Making / ED Course    Medical Decision Making:    Linda Curry is a 28 y.o. female with past medical  history as below, significant for no significant medical history who presents to the ED with complaint of right thumb/wrist pain.. The complaint involves an extensive differential diagnosis and also carries with it a high risk of complications and morbidity.  Serious etiology was considered. Ddx includes but is not limited to: Sprain, strain, dislocation, tendinitis, tenosynovitis, carpal tunnel, etc.  Complete initial physical exam performed, notably the patient was in no distress, sitting comfortably.    Reviewed and confirmed nursing documentation for past medical history, family history, social history.  Vital signs reviewed.       Brief summary: 28 year old female history as above here with right wrist and thumb pain after knuckle cracking.  Exam is reassuring, she does have positive finklestein test on the right. UE are NVI b/l. No external signs of trauma. No pain to elbow or shoulder ipsilateral  Concern for possible De Quervain tenosynovitis or possible tendinopathy. No evidence for infection.  X-ray was unremarkable.  Extremities neurovasc intact.  Placed in wrist brace, NSAIDs, rest.  Follow-up with hand specialist  The patient improved significantly and was discharged in stable condition. Detailed discussions were had with the patient/guardian regarding current findings, and need for close f/u with PCP or on call doctor. The patient/guardian has been instructed to return immediately if the symptoms worsen in any way for re-evaluation. Patient/guardian verbalized understanding and is in agreement with current care plan. All questions answered prior to discharge.                 Additional history obtained: -Additional history obtained from na -External records from outside source obtained and reviewed including: Chart review including previous notes, labs, imaging, consultation notes including  Home meds Allergy list   Lab Tests: na  EKG   EKG  Interpretation Date/Time:    Ventricular Rate:    PR Interval:    QRS Duration:    QT Interval:    QTC Calculation:   R Axis:      Text Interpretation:           Imaging  Studies ordered: I ordered imaging studies including xr  hand r I independently visualized the following imaging with scope of interpretation limited to determining acute life threatening conditions related to emergency care; findings noted above I independently visualized and interpreted imaging. I agree with the radiologist interpretation   Medicines ordered and prescription drug management: No orders of the defined types were placed in this encounter.   -I have reviewed the patients home medicines and have made adjustments as needed   Consultations Obtained: na   Cardiac Monitoring: Continuous pulse oximetry interpreted by myself, 100% on RA.    Social Determinants of Health:  Diagnosis or treatment significantly limited by social determinants of health: na   Reevaluation: After the interventions noted above, I reevaluated the patient and found that they have stayed the same  Co morbidities that complicate the patient evaluation  Past Medical History:  Diagnosis Date   Infection    UTI      Dispostion: Disposition decision including need for hospitalization was considered, and patient discharged from emergency department.    Final Clinical Impression(s) / ED Diagnoses Final diagnoses:  None        Sloan Leiter, DO 07/17/23 2042

## 2023-10-23 DIAGNOSIS — Z130 Encounter for screening for diseases of the blood and blood-forming organs and certain disorders involving the immune mechanism: Secondary | ICD-10-CM | POA: Diagnosis not present

## 2023-10-23 DIAGNOSIS — Z3201 Encounter for pregnancy test, result positive: Secondary | ICD-10-CM | POA: Diagnosis not present

## 2023-11-28 DIAGNOSIS — Z124 Encounter for screening for malignant neoplasm of cervix: Secondary | ICD-10-CM | POA: Diagnosis not present

## 2023-11-28 DIAGNOSIS — Z01419 Encounter for gynecological examination (general) (routine) without abnormal findings: Secondary | ICD-10-CM | POA: Diagnosis not present

## 2024-02-07 DIAGNOSIS — F411 Generalized anxiety disorder: Secondary | ICD-10-CM | POA: Diagnosis not present

## 2024-02-07 DIAGNOSIS — Z6379 Other stressful life events affecting family and household: Secondary | ICD-10-CM | POA: Diagnosis not present

## 2024-02-13 DIAGNOSIS — F331 Major depressive disorder, recurrent, moderate: Secondary | ICD-10-CM | POA: Diagnosis not present

## 2024-02-18 DIAGNOSIS — F331 Major depressive disorder, recurrent, moderate: Secondary | ICD-10-CM | POA: Diagnosis not present

## 2024-03-03 DIAGNOSIS — F411 Generalized anxiety disorder: Secondary | ICD-10-CM | POA: Diagnosis not present

## 2024-03-24 DIAGNOSIS — F411 Generalized anxiety disorder: Secondary | ICD-10-CM | POA: Diagnosis not present

## 2024-03-25 ENCOUNTER — Emergency Department (HOSPITAL_BASED_OUTPATIENT_CLINIC_OR_DEPARTMENT_OTHER)
Admission: EM | Admit: 2024-03-25 | Discharge: 2024-03-25 | Disposition: A | Discharge status: Home / Self Care | Attending: Emergency Medicine | Admitting: Emergency Medicine

## 2024-03-25 ENCOUNTER — Other Ambulatory Visit: Payer: Self-pay

## 2024-03-25 ENCOUNTER — Encounter (HOSPITAL_BASED_OUTPATIENT_CLINIC_OR_DEPARTMENT_OTHER): Payer: Self-pay

## 2024-03-25 DIAGNOSIS — R059 Cough, unspecified: Secondary | ICD-10-CM | POA: Diagnosis not present

## 2024-03-25 DIAGNOSIS — J029 Acute pharyngitis, unspecified: Secondary | ICD-10-CM

## 2024-03-25 DIAGNOSIS — M542 Cervicalgia: Secondary | ICD-10-CM | POA: Insufficient documentation

## 2024-03-25 DIAGNOSIS — J039 Acute tonsillitis, unspecified: Secondary | ICD-10-CM

## 2024-03-25 DIAGNOSIS — B9789 Other viral agents as the cause of diseases classified elsewhere: Secondary | ICD-10-CM | POA: Diagnosis not present

## 2024-03-25 DIAGNOSIS — J069 Acute upper respiratory infection, unspecified: Secondary | ICD-10-CM | POA: Diagnosis not present

## 2024-03-25 LAB — RESP PANEL BY RT-PCR (RSV, FLU A&B, COVID)  RVPGX2
Influenza A by PCR: NEGATIVE
Influenza B by PCR: NEGATIVE
Resp Syncytial Virus by PCR: NEGATIVE
SARS Coronavirus 2 by RT PCR: NEGATIVE

## 2024-03-25 MED ORDER — LIDOCAINE VISCOUS HCL 2 % MT SOLN: 5.0000 mL | Freq: Three times a day (TID) | OROMUCOSAL | 0 refills | Status: AC | PRN

## 2024-03-25 NOTE — ED Triage Notes (Signed)
 Cough with green mucus, body aches, congestion since Wednesday. Pt has been taking dayquil with no relief.

## 2024-03-25 NOTE — ED Provider Notes (Signed)
 [place] [facility] Provider Note   CSN: [id number] Arrival date & time: 03/25/24  1719     Patient presents with: Cough   Linda Curry is a 28 y.o. female.   Patient is here today for evaluation of cough, body aches, and sore throat.  Patient reports body aches began last Wednesday.  Then she developed a cough that is worse at night.  Over the last couple days the cough has become productive with green sputum production.  She reports sore throat with swallowing.  She has continued to be able to swallow fluids and food with no regurgitation or vomiting.  Denies fevers, shortness of breath, chest pain, dizziness, syncope, or GI symptoms.  She is here today for further evaluation as she wants to be sure she does not have pneumonia or something she may transfer to her 81-year-old daughter. Denies history of asthma.  The history is provided by the patient.  Cough Associated symptoms: sore throat        Prior to Admission medications   Medication Sig Start Date End Date Taking? Authorizing Provider  acetaminophen  (TYLENOL ) 325 MG tablet Take 2 tablets (650 mg total) by mouth every 6 (six) hours as needed. 07/17/23   Elnor Jayson LABOR, DO  cyclobenzaprine  (FLEXERIL ) 10 MG tablet Take 1 tablet (10 mg total) by mouth 2 (two) times daily as needed for muscle spasms. 07/17/23   Elnor Jayson LABOR, DO  diphenhydrAMINE  (BENADRYL ) 25 MG tablet Take 1 tablet (25 mg total) by mouth every 6 (six) hours as needed for up to 1 day. 12/24/18 12/25/18  Joy, Shawn C, PA-C  EPINEPHrine  0.3 mg/0.3 mL IJ SOAJ injection Inject 0.3 mLs (0.3 mg total) into the muscle as needed for anaphylaxis. 12/24/18   Joy, Shawn C, PA-C  famotidine  (PEPCID ) 20 MG tablet Take 1 tablet (20 mg total) by mouth 2 (two) times daily for 3 days. 12/24/18 12/27/18  Joy, Shawn C, PA-C  ibuprofen  (ADVIL ) 600 MG tablet Take 1 tablet (600 mg total) by mouth every 6 (six) hours as needed. 07/17/23   Elnor Jayson LABOR, DO  ondansetron  (ZOFRAN -ODT) 4 MG disintegrating tablet Take 1 tablet (4 mg total) by mouth every 8 (eight) hours as needed. 03/01/23   Dean Clarity, MD  Prenatal Vit-Fe Fumarate-FA (PRENATAL MULTIVITAMIN) TABS tablet Take 1 tablet by mouth daily at 12 noon.    [provider]  Pyridoxine HCl (B-6 PO) Take 1 tablet by mouth at bedtime.    [provider]    Allergies: Banana    Review of Systems  HENT:  Positive for sore throat.   Respiratory:  Positive for cough.     Updated Vital Signs BP 115/77 (BP Location: Right Arm)   Pulse 85   Temp 98.5 F (36.9 C) (Oral)   Resp 16   Ht 5' (1.524 m)   Wt 64.4 kg   SpO2 100%   BMI 27.73 kg/m   Physical Exam Vitals and nursing note reviewed.  Constitutional:      Appearance: Normal appearance.  HENT:     Head: Normocephalic and atraumatic.     Nose: Nose normal. No congestion or rhinorrhea.     Mouth/Throat:     Mouth: Mucous membranes are moist.     Tongue: No lesions. Tongue does not deviate from midline.     Pharynx: Posterior oropharyngeal erythema present.     Tonsils: No tonsillar exudate or tonsillar abscesses.     Comments:  Erythema to the pharynx.  Mild swelling and erythema to the left tonsil. Eyes:     General: No scleral icterus.    Extraocular Movements: Extraocular movements intact.     Conjunctiva/sclera: Conjunctivae normal.  Cardiovascular:     Rate and Rhythm: Normal rate and regular rhythm.     Pulses: Normal pulses.     Heart sounds: Normal heart sounds.  Pulmonary:     Effort: Pulmonary effort is normal. No respiratory distress.     Breath sounds: Normal breath sounds. No stridor. No wheezing, rhonchi or rales.  Musculoskeletal:     Cervical back: Normal range of motion and neck supple. Tenderness (left sided) present.  Lymphadenopathy:     Cervical: No cervical adenopathy (non palpable).  Skin:    General: Skin is warm and dry.     Capillary Refill: Capillary refill takes less  than 2 seconds.     Coloration: Skin is not jaundiced or pale.  Neurological:     Mental Status: She is alert and oriented to person, place, and time.     (all labs ordered are listed, but only abnormal results are displayed) Labs Reviewed  RESP PANEL BY RT-PCR (RSV, FLU A&B, COVID)  [id number]    EKG: None  Radiology: No results found.  Procedures   Medications Ordered in the ED - No data to display    Patient presents to the ED for concern of productive cough and sore throat, this involves an extensive number of treatment options, and is a complaint that carries with it a high risk of complications and morbidity.  The differential diagnosis includes pneumonia, viral URI, PE.  Considering patient is nontachycardic and nontachypneic there is low suspicion for PE.  Patient is afebrile and clear lung sounds throughout lowering suspicion of pneumonia.  Lab Tests:  I Ordered, and personally interpreted labs.  The pertinent results include: Respiratory panel negative   Problem List / ED Course:  Patient is here for evaluation of productive cough and sore throat.  Patient offered chest x-ray for further evaluation for pneumonia however she does not feel like this additional imaging is necessary at this time and I agree.   Reevaluation:  After the interventions noted above, I reevaluated the patient and found that they have :stayed the same   Dispostion:  After consideration of the diagnostic results and the patients response to treatment, I feel that the patent would benefit from supportive care in the home setting.  I have sent Magic mouthwash to her pharmacy for pharyngitis.  Return precautions given.   Medical Decision Making    Final diagnoses:  None    ED Discharge Orders     None          Rosina Almarie DELENA DEVONNA 03/25/24 2310    Dreama Longs, MD 03/26/24 [phone]

## 2024-04-07 DIAGNOSIS — F411 Generalized anxiety disorder: Secondary | ICD-10-CM | POA: Diagnosis not present

## 2024-04-21 DIAGNOSIS — F411 Generalized anxiety disorder: Secondary | ICD-10-CM | POA: Diagnosis not present

## 2024-04-28 DIAGNOSIS — F411 Generalized anxiety disorder: Secondary | ICD-10-CM | POA: Diagnosis not present
